# Patient Record
Sex: Female | Born: 1955 | Race: White | Hispanic: Yes | Marital: Married | Smoking: Never smoker
Health system: Northeastern US, Community
[De-identification: ages and names within clinical notes are randomized; demographics above are authoritative.]

## PROBLEM LIST (undated history)

## (undated) VITALS — BP 135/75 | HR 76 | Temp 98.0°F | Resp 20 | Ht 60.0 in | Wt 169.0 lb

## (undated) DIAGNOSIS — Z789 Other specified health status: Secondary | ICD-10-CM

---
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
## 1998-11-17 ENCOUNTER — Ambulatory Visit (HOSPITAL_BASED_OUTPATIENT_CLINIC_OR_DEPARTMENT_OTHER): Payer: Self-pay | Admitting: Internal Medicine

## 1998-11-22 ENCOUNTER — Ambulatory Visit (HOSPITAL_BASED_OUTPATIENT_CLINIC_OR_DEPARTMENT_OTHER): Payer: Self-pay | Admitting: Internal Medicine

## 2001-04-25 ENCOUNTER — Emergency Department (HOSPITAL_BASED_OUTPATIENT_CLINIC_OR_DEPARTMENT_OTHER): Payer: Self-pay | Admitting: Emergency Medicine

## 2002-11-05 ENCOUNTER — Ambulatory Visit (HOSPITAL_BASED_OUTPATIENT_CLINIC_OR_DEPARTMENT_OTHER): Payer: Self-pay | Admitting: Internal Medicine

## 2003-06-11 ENCOUNTER — Ambulatory Visit (HOSPITAL_BASED_OUTPATIENT_CLINIC_OR_DEPARTMENT_OTHER): Payer: Self-pay

## 2003-07-12 ENCOUNTER — Ambulatory Visit (HOSPITAL_BASED_OUTPATIENT_CLINIC_OR_DEPARTMENT_OTHER): Payer: Self-pay

## 2003-07-12 LAB — CHG LIPOPROTEIN DIR MEAS HIGH DENSITY CHOLESTEROL: HIGH DENSITY LIPOPROTEIN: 44 mg/dl (ref 35–95)

## 2003-07-12 LAB — BLOOD COUNT COMPLETE AUTOMATED
HEMATOCRIT: 38.4 % (ref 37.0–47.0)
HEMOGLOBIN: 13.3 g/dL (ref 12.0–16.0)
MEAN CORP HGB CONC: 34.6 g/dL (ref 32.0–36.0)
MEAN CORPUSCULAR HGB: 28.4 pg (ref 27.0–31.0)
MEAN CORPUSCULAR VOL: 82.3 fL (ref 81.0–99.0)
MEAN PLATELET VOLUME: 11.5 fL — ABNORMAL HIGH (ref 6.4–10.8)
PLATELET COUNT: 178 10*3/uL (ref 150–400)
RBC DISTRIBUTION WIDTH: 13.8 % (ref 11.5–14.3)
RED BLOOD CELL COUNT: 4.67 MIL/uL (ref 4.20–5.40)
WHITE BLOOD CELL COUNT: 5.5 10*3/uL (ref 4.8–10.8)

## 2003-07-12 LAB — CHOLESTEROL SERUM/WHOLE BLOOD TOTAL: Cholesterol: 227 mg/dl — ABNORMAL HIGH (ref 0–200)

## 2003-07-12 LAB — CYTOPATH, C/V, THIN LAYER

## 2003-07-13 LAB — IADNA CHLAMYDIA TRACHOMATIS DIRECT PROBE TQ: GENPROBE CHLAMYDIA: NEGATIVE

## 2003-07-13 LAB — IADNA NEISSERIA GONORRHOEAE DIRECT PROBE TQ: GENPROBE GC: NEGATIVE

## 2003-09-27 ENCOUNTER — Other Ambulatory Visit (HOSPITAL_BASED_OUTPATIENT_CLINIC_OR_DEPARTMENT_OTHER): Payer: Self-pay

## 2003-09-27 DIAGNOSIS — Z1231 Encounter for screening mammogram for malignant neoplasm of breast: Secondary | ICD-10-CM

## 2003-11-14 LAB — MA SCREENING MAMMO BILATERAL WITH CAD

## 2005-02-12 ENCOUNTER — Encounter (HOSPITAL_BASED_OUTPATIENT_CLINIC_OR_DEPARTMENT_OTHER): Payer: Self-pay

## 2005-02-12 ENCOUNTER — Ambulatory Visit (HOSPITAL_BASED_OUTPATIENT_CLINIC_OR_DEPARTMENT_OTHER): Payer: HMO

## 2005-02-12 VITALS — BP 136/86 | HR 84 | Temp 98.0°F | Ht 59.0 in | Wt 158.0 lb

## 2005-02-12 DIAGNOSIS — G5600 Carpal tunnel syndrome, unspecified upper limb: Secondary | ICD-10-CM | POA: Insufficient documentation

## 2005-02-12 DIAGNOSIS — B079 Viral wart, unspecified: Secondary | ICD-10-CM

## 2005-02-12 MED ORDER — WRIST SPLINT MISC
Status: DC
Start: 2005-02-12 — End: 2010-02-28

## 2005-02-12 NOTE — Progress Notes (Signed)
S: Deborah Mathis is a 49 year old female who complains of numbness of lateral aspect of bilateral hand, especially with use of the hand and at night. R handed, no new taks, activities, no trauma. R>L, but mostly feel about the same. Not every night, better when she can shake it off. Not really pain, just the numbness.     Wart: L hand, middle finger. Large, bothersome, for a few weeks. No pain, itching. Would like treatment.     Hands sweat a lot, day and night, for some time .,not elsewhere. Has tried a poder over the counter, always been this way.     Questions re her husband's prostate bx, upcoming, unhappy with anesth available .advised discuss with PCP, and/or specialist, am unqualified to decide type of anesthesia provided.     Of note, pt arrived for appt 20 minutes late.     O:  BP 136/86   Pulse 84   Temp (Src) 98 (Oral)   Ht 4\' 11"  (1.64m)   Wt 158 lbs (71.7kg)   SaO2 100%    She appears well, NAD. Hand exam revealed bilaterally normal motor power and no atrophy. No nodules are noted.   RIGHT hand: Phalen's sign negative - Tinel's sign is negative.  LEFT hand: Phalen is negative - Tinel is negative. No hypothenar wasting. Grip strength, pulses and ROM hand, wrist nl, ROM. Neck: supple, no LAD. Nl finger to thumb strength.    Large wart noted L middle finger, thumb side at DIP. Reviewed r/b treatment. Cleansed, debrided slightly. Treated with 50 secinds histofreeze until whitening, pt tolerated well. Covered.     Hands bil are slightly sweaty to the touch. Nl skin tone/texture elsewhere.     A: Carpal tunnel syndrome.   wart    P: Explanation of median nerve entrapment is given. A wrist splint (for bil) is provided for prn use, especially at night. The treatment spectrum is discussed, including possible surgical release if the symptoms are persistent and severe. Return as needed for this problem.    Retreat wart in 2 weeks. Can try OTC antiperspirants for her pams, readdress when mmore time permits, or f/up  with her PCP for a PE.

## 2005-02-25 ENCOUNTER — Ambulatory Visit (HOSPITAL_BASED_OUTPATIENT_CLINIC_OR_DEPARTMENT_OTHER): Payer: HMO

## 2005-03-04 ENCOUNTER — Ambulatory Visit (HOSPITAL_BASED_OUTPATIENT_CLINIC_OR_DEPARTMENT_OTHER): Payer: HMO

## 2005-03-04 VITALS — BP 120/88 | HR 78 | Temp 98.4°F

## 2005-03-04 DIAGNOSIS — B079 Viral wart, unspecified: Secondary | ICD-10-CM

## 2005-03-04 DIAGNOSIS — Q828 Other specified congenital malformations of skin: Secondary | ICD-10-CM

## 2005-03-04 NOTE — Progress Notes (Signed)
Deborah Mathis is a 50 year old female  Here for repeat wart treatment. Seen 4/11. Better with the treatment, almost gone.   Would like referral to derm for skin lesion above R eye, there for yrs, another bewteen eyebrows R side. One eyelid seems to be grwoing a bit. No pain, bleeding. Otherwise well. Advised full PE with PPC, will bok for later in the summer.     OBJECTIVE:  BP 120/88   Pulse 78   Temp (Src) 98.4 (Oral)   SaO2 99%  Wart L hand, middle finger.   Area cleansed, dry, top section debrided. Shitorfreeze applied for 50 seconds. Pt tolerated well. Covered.   Warty growth R eyelid, about 2x3 mm. Medial side face between eyebrows, smooth, about 5x5 mm at base.   ASSESSMENT:  Wart  PLAN:  Repeat in about 1-2 weks if no resolution. Ref to plastics for removal. Reassured not dangerous. Will call pr.

## 2005-03-25 ENCOUNTER — Other Ambulatory Visit (HOSPITAL_BASED_OUTPATIENT_CLINIC_OR_DEPARTMENT_OTHER): Payer: HMO | Admitting: Plastic Surgery

## 2005-03-25 DIAGNOSIS — D3190 Benign neoplasm of unspecified part of unspecified eye: Secondary | ICD-10-CM

## 2005-03-25 DIAGNOSIS — D367 Benign neoplasm of other specified sites: Secondary | ICD-10-CM

## 2005-04-22 ENCOUNTER — Encounter (HOSPITAL_BASED_OUTPATIENT_CLINIC_OR_DEPARTMENT_OTHER): Payer: Self-pay

## 2005-04-22 ENCOUNTER — Ambulatory Visit (HOSPITAL_BASED_OUTPATIENT_CLINIC_OR_DEPARTMENT_OTHER): Payer: HMO

## 2005-04-22 VITALS — BP 120/80 | HR 82 | Temp 98.0°F | Ht 59.5 in | Wt 161.0 lb

## 2005-04-22 DIAGNOSIS — J209 Acute bronchitis, unspecified: Secondary | ICD-10-CM

## 2005-04-22 DIAGNOSIS — R7611 Nonspecific reaction to tuberculin skin test without active tuberculosis: Secondary | ICD-10-CM | POA: Insufficient documentation

## 2005-04-22 MED ORDER — DOXYCYCLINE HYCLATE 100 MG PO TABS
ORAL_TABLET | ORAL | Status: DC
Start: 2005-04-22 — End: 2010-02-28

## 2005-04-22 NOTE — Progress Notes (Signed)
SUBJECTIVE:   Deborah Mathis is a 49 year old female who complains of sore throat, chest congestion, productive cough, chest pain during cough and productive cough for 10 days. She denies a history of fevers and denies a history of asthma. Patient does not smoke cigarettes.   taking otc cold meds  Pmhx: positive PPD  OBJECTIVE:  BP 120/80   Pulse 82   Temp (Src) 98 (Oral)   Ht 4' 11.5" (1.42m)   Wt 161 lbs (73.0kg)   SaO2 97%   LMP 04/05/2005    She appears well, vital signs are as noted by the nurse. Ears normal. Throat and pharynx normal. Neck supple. No adenopathy in the neck. Nose is congested. Sinuses non tender. The chest is clear, without wheezes or rales.    ASSESSMENT:   bronchitis    PLAN:  Symptomatic therapy suggested: push fluids, rest and ROV prn if symptoms persist or worsen. Call or return to clinic prn if these symptoms worsen or fail to improve as anticipated.

## 2005-04-27 ENCOUNTER — Ambulatory Visit (HOSPITAL_BASED_OUTPATIENT_CLINIC_OR_DEPARTMENT_OTHER): Payer: HMO | Admitting: Internal Medicine

## 2005-04-27 VITALS — BP 110/70 | HR 78 | Temp 98.0°F | Wt 164.0 lb

## 2005-04-27 DIAGNOSIS — J209 Acute bronchitis, unspecified: Secondary | ICD-10-CM

## 2005-04-27 DIAGNOSIS — K219 Gastro-esophageal reflux disease without esophagitis: Secondary | ICD-10-CM

## 2005-04-27 LAB — XR CHEST 2 VIEWS

## 2005-04-27 MED ORDER — ONE-A-DAY WOMENS FORMULA PO TABS
ORAL_TABLET | ORAL | Status: DC
Start: 2005-04-27 — End: 2010-02-28

## 2005-04-27 MED ORDER — ACETAMINOPHEN-CODEINE #3 300-30 MG PO TABS
ORAL_TABLET | ORAL | Status: DC
Start: 2005-04-27 — End: 2010-02-28

## 2005-04-27 MED ORDER — PEPCID AC 10 MG PO TABS
ORAL_TABLET | ORAL | Status: DC
Start: 2005-04-27 — End: 2010-02-28

## 2005-04-27 NOTE — Progress Notes (Signed)
Deborah Mathis is a 49 year old female who was recently seen by Dr. Samuel Bouche for a cough of acute bronchitis. She was treated with doxycycline. The cough is slighly improved and she feels generally better systemically but the cough is still quite severe at night and it is keeping her awake. No fevers or SOB.    She also has GERD which is worse with the cough. She uses OTC Pepcid AC prn.    Social History   Marital Status: Married    Social History Main Topics   Tobacco Use: Never    Alcohol Use: No       She wondered about vitamins and I suggested One-a-Day [employer].    BP 110/70   Pulse 78   Temp (Src) 98 (Oral)   Wt 164 lbs (74.4kg)   SaO2 100%   EENT: Coryza  Lungs: There are no wheezes. There are some coarse bronchial breath sounds in the right upper lobe, heard best anteriorly.    IMP:  Because of focal findings on chest exam, I woud like to see a chest X-ray. She will get that today. It is also reasonable to give her some T#3 as a cough suppressant and she can supplement that with Robitussin DM.    466.0 ACUTE BRONCHITIS (primary encounter diagnosis)  Note: as noted above.   Plan: ACETAMINOPHEN-CODEINE #3 300-30 MG OR TABS       530.81 ESOPHAGEAL REFLUX  Note: As noted.  Plan: PEPCID AC 10 MG OR TABS

## 2005-05-19 ENCOUNTER — Encounter (HOSPITAL_BASED_OUTPATIENT_CLINIC_OR_DEPARTMENT_OTHER): Payer: Self-pay | Admitting: Internal Medicine

## 2005-09-30 ENCOUNTER — Encounter (HOSPITAL_BASED_OUTPATIENT_CLINIC_OR_DEPARTMENT_OTHER): Payer: HMO | Admitting: Plastic Surgery

## 2005-09-30 DIAGNOSIS — D2310 Other benign neoplasm of skin of unspecified eyelid, including canthus: Secondary | ICD-10-CM

## 2005-10-02 LAB — SURGICAL PATH SPECIMEN

## 2005-10-03 LAB — SURG SPEC CLINIC NOTE

## 2005-10-07 ENCOUNTER — Ambulatory Visit (HOSPITAL_BASED_OUTPATIENT_CLINIC_OR_DEPARTMENT_OTHER): Payer: HMO | Admitting: Plastic Surgery

## 2005-10-07 DIAGNOSIS — D2330 Other benign neoplasm of skin of unspecified part of face: Secondary | ICD-10-CM

## 2005-11-05 LAB — SURG SPEC CLINIC NOTE

## 2010-01-11 ENCOUNTER — Encounter (HOSPITAL_BASED_OUTPATIENT_CLINIC_OR_DEPARTMENT_OTHER): Payer: Self-pay

## 2010-01-12 ENCOUNTER — Ambulatory Visit (HOSPITAL_BASED_OUTPATIENT_CLINIC_OR_DEPARTMENT_OTHER): Payer: BLUE CROSS/BLUE SHIELD

## 2010-01-12 ENCOUNTER — Encounter (HOSPITAL_BASED_OUTPATIENT_CLINIC_OR_DEPARTMENT_OTHER): Payer: Self-pay

## 2010-01-12 VITALS — BP 120/78 | HR 80 | Temp 98.1°F | Ht 60.08 in | Wt 169.0 lb

## 2010-01-12 DIAGNOSIS — Z23 Encounter for immunization: Secondary | ICD-10-CM

## 2010-01-12 DIAGNOSIS — Z0000 Encounter for general adult medical examination without abnormal findings: Secondary | ICD-10-CM

## 2010-01-12 DIAGNOSIS — M19049 Primary osteoarthritis, unspecified hand: Secondary | ICD-10-CM

## 2010-01-12 DIAGNOSIS — K219 Gastro-esophageal reflux disease without esophagitis: Secondary | ICD-10-CM

## 2010-01-12 DIAGNOSIS — Z1159 Encounter for screening for other viral diseases: Secondary | ICD-10-CM

## 2010-01-12 DIAGNOSIS — Z124 Encounter for screening for malignant neoplasm of cervix: Secondary | ICD-10-CM

## 2010-01-12 DIAGNOSIS — N852 Hypertrophy of uterus: Secondary | ICD-10-CM

## 2010-01-12 MED ORDER — IBUPROFEN 600 MG PO TABS
600.0000 mg | ORAL_TABLET | Freq: Three times a day (TID) | ORAL | Status: DC | PRN
Start: 2010-01-12 — End: 2011-05-03

## 2010-01-12 MED ORDER — LANSOPRAZOLE 15 MG PO CPDR
15.00 mg | DELAYED_RELEASE_CAPSULE | Freq: Every day | ORAL | Status: AC
Start: 2010-01-12 — End: 2011-01-13

## 2010-01-12 NOTE — Patient Instructions (Signed)
[facility]  [facility]  [address]  [place] [address]  Clinical Nutrition Services    Calcium and Your Health    Calcium is a mineral that helps build strong bones and teeth, helps prevent brittle bones (osteoporosis) and hip fractures, and helps maintain a normal blood pressure. It is very important to get enough calcium each day.    * CALCIUM NEEDS AT ALL AGES: (RDI 2000)  Infants birth to 12 months  is 210 -270 mg/day          Child 1-8 years  is 500-.800 mg/day                     Adolescent 9-18years is 1300 mg/day                   Adults 19-30 years is 1000 mg/day  Adults 31-50 years is 1000 mg/day  Adults 51 and older is 1200 mg/day    * TO MAINTAIN GOOD BONE HEALTH, EAT A CALCIUM RICH DIET:    FOODS                                      Yogurt, plain, nonfat: 1 cup serving is 450 mgs of calcium    Ricotta cheese, part skim: 1/2 cup serving is 340 mgs of calcium    Milk, skim: 1 cup serving is 300 mgs of calcium    Milk, whole: 1 cup serving is 290 mgs of calcium    Sardines, canned, with bones: 3 oz serving is 280 mgs of calcium    Yogurt, plain, whole milk:1 cup serving is 275 mgs of calcium    Orange juice, calcium fortified:1 cup serving is 270 mgs of calcium    Swiss cheese: 1 oz serving is 270 mgs of calcium    Spinach, cooked: 1 cup serving is 240 mgs of calcium   Turnip greens, rhubarb, cooked:1 cup serving is 200 mgs of calcium   Salmon, canned, with bones: 3 oz serving is 180 mgs of calcium   Ice Cream: 1 cup serving is 175 mgs of calcium   Pudding, instant mix: 1/2 cup serving is 150 mgs of calcium   Almonds:  /2 cup serving is 150 mgs of calcium   Kale, cooked: 1 cup serving is 100 mgs of calcium   Lobster, cooked: 6 oz serving is 100 mgs of calcium   Tofu, with calcium sulfate:  3oz serving is 100 mgs of calcium     EXERCISE: Weight bearing exercises, such as walking, jogging, racquet sports, aerobics, and weight training  help make bones stronger.     VITAMIN  D: Vitamin D is essential to help your body absorb calcium. A healthy body can make its own vitamin D with the help of sunlight. But in the winter months, you may not get enough sun, and should make   sure you get it from another source, like milk or a multivitamin.     HORMONES: The hormone estrogen helps the female body and bones to use calcium. After menopause women need to discuss hormone treatment with their doctor.    * This is just a first step in helping improve your health.  * For help with a meal plan for you, make an appointment with the Nutritionist.  * For more information, you can also contact the National Nutrition Hotline 1-800-366-1655                                                                9/95 MAS 1/96, 12/97eqj, 01/02, 4/02

## 2010-01-12 NOTE — Progress Notes (Signed)
FEMALE PHYSICAL      SUBJECTIVE:    Deborah Mathis is a 54 year old female who presents for a physical exam. She was last seen here 5 years ago, and in interim has had no regular health care.      The patient's current concerns are arthritis of hands, intermittent; does assembly work, many fine motor movements. Worse in damp weather. Pain releived with ibuprofen 400 mg. No other joints involved.    Also reports reflux symptoms occasionally , exacerbated with fried food, caffeine, alcohol.. Prevacid OTC relieves completely; main symptoms is heartburn    Patient Active Problem List:     CARPAL TUNNEL SYNDROME [354.0]     TUBERCULIN TEST REACTION NO TBC [795.5]      Medication reconciliation performed today.    Social History    Marital Status: Married             Spouse Name:                       Years of Education:                 Number of children:               Social History Main Topics    Tobacco Use: Never           Alcohol Use: No                No family history on file.    I have reviewed the past medical, surgical, social and family history and updated these sections of EpicCare as relevant. All interim labs, test results, and consult notes were reviewed and discussed with [name]. Medications were reconciled during this visit and a current medication list was given to the patient at the end of the visit.    REVIEW OF SYSTEMS:  CONSTITUTIONAL:  Denies fatigue,  EYES:  Denies visual changes or pain  ENT:  Denies sore throat, nasal discharge, sinus pain, difficulty hearing.  CARDIOVASCULAR:  Denies chest pain, palpitations or swelling  RESPIRATORY:  Denies cough or shortness of breath  GI:  Denies abdominal pain, nausea, vomiting; no change in bowel habits  MUSCULOSKELETAL:  Denies back pain  SKIN:  No rash or worrisome lesions   NEUROLOGIC:  Denies headache, focal weakness or sensory changes. Has noted dome numbness and tingling  In left hand when she wakes up in AM; usually relieved by  shaking oout her hand. Does have hx of carpal tunnel syndrome   ENDOCRINE:   Weight gain of 10 lbs in last 5 years  GYN: still having regular menses, heavy, dysmenorrhea on first day.  Has not used contraception in 20 years, without conception  LYMPHATIC:  Denies swollen glands  PSYCHIATRIC:  Denies depression, anxiety  All systems negative except as marked.    OBJECTIVE:  Vitals:BP 120/78   Pulse 80   Temp(Src) 98.1 F (36.7 C) (Oral)   Ht 5' 0.08" (1.526 m)   Wt 169 lb (76.658 kg)   SpO2 98%  Constitutional:   WDWN obese woman, alert, cooperative and in no apparent distress   HENT: Normocephalic, Atraumatic, Bilateral external ears normal, Oropharynx moist, No oral exudates, Nose normal.  Eyes:  PERRL, EOMI, Conjunctiva normal, No discharge.  Neck: Normal range of motion, No tenderness, Supple, No lymphadenopathy,   Thyroid: No thyromegaly, non-tender, no nodules.  Cardiovascular:  Regular rate and rhythm.  No murmurs, clicks, rubs.  No  S3, S4.  Pulmonary/Chest:  Normal breath sounds, No respiratory distress, No wheezing, rales,or rhonchi. No chest tenderness  Breasts: Symmetric, No masses or tenderness, No nipple discharge.   Abdomen:  Bowel sounds normal, soft.  No tenderness, no masses, no hepatosplenomegaly  Back:  No musculoskeletal tenderness, No CVA tenderness  Extremities:  Normal range of motion, Intact distal pulses, No edema, No tenderness. Negative tinel's nad PHelan's signs  Pelvic:  Normal external genitalia.  Normal vagina with no polyps or lesions and with some dark blood (end of menses).  Normal cervix with normal mucosa and without CMT.  Uterus: Nontender, but  Enlarged to 12-16 week size. no nodules. Adnexae negative.  Lymphatic:  No lymphadenopathy noted  Neurologic:  Alert & oriented , Normal motor function, Normal sensory function, No focal defecits, normal gait  Skin:  Warm, Dry, No erythema, No rash  Psychiatric:  Affect normal, Judgement normal, Mood normal    ASSESSMENT/PLAN:    V70.0  Routine general medical examination at a health care facility  (primary encounter diagnosis)  Comment: generally in god health although obese. No regular exercise. 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  Comment: I doubt systemic arthritis, but she does seem yong to be starting Heberdens' nodes and deviation of DIP joints  Plan: ibuprofen (ADVIL,MOTRIN) 600 MG tablet, URIC         ACID BLD, ANTINUCLEAR ANTIBODY, RHEUMATOID         FACTOR QUAL          621.2A Enlarged uterus  Comment: most likely fibroid  Plan: ORDER FOR ULTRASOUND        pelvic    530.81K GERD (gastroesophageal reflux disease)  Comment: more problematic when needing to take ibuprofen   Plan: lansoprazole (PREVACID) 15 MG capsule        Daily as needed    V73.89 Special screening examination for other specified viral diseases  Comment:   Plan: HUMAN PAPILLOMAVIRUS            V76.2 Screening for malignant neoplasm of the cervix  Comment:   Plan: CYTOPATH, C/V, THIN LAYER            V06.1P Need for Tdap vaccination  Comment: overdue  Plan: TDAP VACCINE 7 YR + IM, TDAP VACCINE 7 YR + IM        Future ordered

## 2010-01-17 ENCOUNTER — Ambulatory Visit (HOSPITAL_BASED_OUTPATIENT_CLINIC_OR_DEPARTMENT_OTHER): Payer: Self-pay

## 2010-01-18 ENCOUNTER — Telehealth (HOSPITAL_BASED_OUTPATIENT_CLINIC_OR_DEPARTMENT_OTHER): Payer: Self-pay

## 2010-01-18 LAB — US PELVIC NON-PREGNANT

## 2010-01-18 LAB — US TRANSVAGINAL NON-OB

## 2010-01-18 LAB — HUMAN PAPILLOMAVIRUS (HPV): HUMAN PAPILLOMAVIRUS: NEGATIVE

## 2010-01-18 LAB — US 3D RENDERING WO DEDICATED WORKSTATION

## 2010-01-18 NOTE — Telephone Encounter (Signed)
I left a message on the patient's voice mail. with resslts of normal pelvic ultrasound plus 2 small fibroids. Asked for her to call back if quesxtions.

## 2010-01-22 ENCOUNTER — Ambulatory Visit (HOSPITAL_BASED_OUTPATIENT_CLINIC_OR_DEPARTMENT_OTHER): Payer: BLUE CROSS/BLUE SHIELD

## 2010-01-22 DIAGNOSIS — M19049 Primary osteoarthritis, unspecified hand: Secondary | ICD-10-CM

## 2010-01-22 DIAGNOSIS — Z0000 Encounter for general adult medical examination without abnormal findings: Secondary | ICD-10-CM

## 2010-01-22 LAB — CBC WITH PLATELET
HEMATOCRIT: 32.7 % — ABNORMAL LOW (ref 36.0–48.0)
HEMOGLOBIN: 10.9 g/dl — ABNORMAL LOW (ref 12.0–16.0)
MEAN CORP HGB CONC: 33.2 g/dl (ref 32.0–36.0)
MEAN CORPUSCULAR HGB: 22.7 pg — ABNORMAL LOW (ref 27.0–33.0)
MEAN CORPUSCULAR VOL: 68.5 fl — CL (ref 80.0–100.0)
MEAN PLATELET VOLUME: 10.2 fl (ref 6.4–10.8)
PLATELET COUNT: 143 10*3/uL — ABNORMAL LOW (ref 150–400)
RBC DISTRIBUTION WIDTH: 18.3 % — ABNORMAL HIGH (ref 11.5–14.3)
RED BLOOD CELL COUNT: 4.77 M/uL (ref 4.50–5.10)
WHITE BLOOD CELL COUNT: 5.2 10*3/uL (ref 4.0–10.8)

## 2010-01-22 NOTE — Progress Notes (Signed)
Labs drawn

## 2010-01-24 LAB — CYTOPATH, C/V, THIN LAYER

## 2010-01-29 ENCOUNTER — Ambulatory Visit (HOSPITAL_BASED_OUTPATIENT_CLINIC_OR_DEPARTMENT_OTHER): Payer: BLUE CROSS/BLUE SHIELD

## 2010-01-29 VITALS — BP 124/80 | HR 79 | Temp 98.6°F | Wt 169.0 lb

## 2010-01-29 DIAGNOSIS — D649 Anemia, unspecified: Secondary | ICD-10-CM

## 2010-01-29 DIAGNOSIS — Z23 Encounter for immunization: Secondary | ICD-10-CM

## 2010-01-29 DIAGNOSIS — D259 Leiomyoma of uterus, unspecified: Secondary | ICD-10-CM

## 2010-01-29 DIAGNOSIS — D509 Iron deficiency anemia, unspecified: Secondary | ICD-10-CM

## 2010-01-29 LAB — CBC, PLATELET & DIFFERENTIAL
BASOPHIL %: 0.6 % (ref 0.0–2.0)
EOSINOPHIL %: 2.3 % (ref 0.0–7.0)
HEMATOCRIT: 33.6 % — ABNORMAL LOW (ref 36.0–48.0)
HEMOGLOBIN: 11.1 g/dl — ABNORMAL LOW (ref 12.0–16.0)
LYMPHOCYTE %: 21.9 % (ref 13.0–39.0)
MEAN CORP HGB CONC: 32.8 g/dl (ref 32.0–36.0)
MEAN CORPUSCULAR HGB: 22.3 pg — ABNORMAL LOW (ref 27.0–33.0)
MEAN CORPUSCULAR VOL: 68 fl — CL (ref 80.0–100.0)
MEAN PLATELET VOLUME: 10 fl (ref 6.4–10.8)
MONOCYTE %: 6.6 % (ref 1.0–12.0)
NEUTROPHIL %: 68.6 % (ref 46.0–79.0)
PLATELET COUNT: 165 10*3/uL (ref 150–400)
RBC DISTRIBUTION WIDTH: 18 % — ABNORMAL HIGH (ref 11.5–14.3)
RED BLOOD CELL COUNT: 4.95 M/uL (ref 4.50–5.10)
WHITE BLOOD CELL COUNT: 7.6 10*3/uL (ref 4.0–10.8)

## 2010-01-29 LAB — LACTATE DEHYDROGENASE: LACTATE DEHYDROGENASE: 135 IU/L (ref 100–242)

## 2010-01-29 LAB — CHG IRON BINDING CAPACITY: TOTAL IRON BIND CAPACITY CALC: 540 ug/dL (ref 282–427)

## 2010-01-29 LAB — IRON: IRON: 20 ug/dl — ABNORMAL LOW (ref 28–170)

## 2010-01-29 LAB — BASIC METABOLIC PANEL
ANION GAP: 5 mmol/L (ref 2–25)
BUN (UREA NITROGEN): 11 mg/dl (ref 6–20)
CALCIUM: 8.7 mg/dl (ref 8.6–10.3)
CARBON DIOXIDE: 24 mmol/L (ref 22–32)
CHLORIDE: 110 mmol/L (ref 101–111)
CREATININE: 0.5 mg/dl (ref 0.4–1.2)
ESTIMATED GLOMERULAR FILT RATE: >60 mL/min (ref >60)
Glucose Random: 106 mg/dl (ref 74–160)
POTASSIUM: 3.9 mmol/L (ref 3.5–5.1)
SODIUM: 139 mmol/L (ref 135–144)

## 2010-01-29 LAB — RBCMORPH

## 2010-01-29 LAB — CHG ASSAY OF FOLIC ACID SERUM: FOLATE: 14.6 ng/mL (ref 3.0–?)

## 2010-01-29 LAB — VITAMIN B12: VITAMIN B12: 418 pg/mL (ref 180–914)

## 2010-01-29 LAB — CHG BLOOD COUNT RETICULOCYTE AUTOMATED: RETICULOCYTE COUNT: 2 % (ref 0.5–2.0)

## 2010-01-29 NOTE — Progress Notes (Signed)
SUBJECTIVE:  Deborah Mathis is a 54 year old female presents today for follow up of anemia, fibroid uterus.    Results: pelvic ultrasound showed 2 smll fibroids, one submucosal    Patient Active Problem List:     CARPAL TUNNEL SYNDROME [354.0]     TUBERCULIN TEST REACTION NO TBC [795.5]     Anemia [285.9Y]      Medication reconciliation performed today.     Social History Narrative    Born: Born [place], came to [place] age 20    Family: lives with husband and son (b. 17)    Education: has GED    Social: 3 bros and 1 sister living in [place] (one brother dec.d/t CAD)    Occupation:assembly line work    Leisure:              Review of Systems  none  OBJECTIVE:  BP 124/80   Pulse 79   Temp(Src) 98.6 F (37 C) (Oral)   Wt 169 lb (76.658 kg)   SpO2 99%   General:  WDWN overweight woman, alert, cooperative and in no apparent distress   Not otherwise examined today.     ASSESSMENT/PLAN:    218.9K Uterine fibroid  Comment: 2 smal fibroids, one submucosal may be source of heavy menses  Plan: repeat ultrasound in 6-12 months    285.9Y Anemia  Comment: 3/30 - results of lab tests sows Fe deficiency anemia.  Plan: COMPLETE CBC, AUTOMATED, IRON, IRON BINDING         TEST, ROUTINE VENIPUNCTURE, VITAMIN B-12, BLOOD        FOLIC ACID SERUM, RETICULOCYTE COUNT        3/20 sent letter to start iron supplements (FeSO4 325 daily to twice dailyf ro 6 months.       Need for Tdap - deferred    follow up 3 months for Hct check

## 2010-01-29 NOTE — Progress Notes (Addendum)
Quick Note:    Discussed with the patient at offic e visit  ______

## 2010-01-31 DIAGNOSIS — D259 Leiomyoma of uterus, unspecified: Secondary | ICD-10-CM | POA: Insufficient documentation

## 2010-01-31 DIAGNOSIS — D509 Iron deficiency anemia, unspecified: Secondary | ICD-10-CM | POA: Insufficient documentation

## 2010-01-31 MED ORDER — FERROUS SULFATE 325 (65 FE) MG PO TABS
325.0000 mg | ORAL_TABLET | Freq: Every day | ORAL | Status: DC
Start: 2010-01-31 — End: 2010-02-28

## 2010-02-02 LAB — CHG LIPID PANEL
Cholesterol: 206 mg/dl — ABNORMAL HIGH (ref 0–200)
HIGH DENSITY LIPOPROTEIN: 38 mg/dl (ref 35–85)
LOW DENSITY LIPOPROTEIN DIRECT: 140 mg/dl — ABNORMAL HIGH (ref 0–100)
RISK FACTOR: 5.4 — ABNORMAL HIGH (ref ?–4.4)
TRIGLYCERIDES: 170 mg/dl — ABNORMAL HIGH (ref 0–150)

## 2010-02-02 LAB — CHG RHEUMATOID FACTOR QUALITATIVE: RHEUMATOID FACTOR: NEGATIVE

## 2010-02-02 LAB — CHG ASSAY OF BLOOD/URIC ACID: URIC ACID: 4.7 mg/dl (ref 2.6–8.0)

## 2010-02-02 LAB — HIV 1 AND 2 PLUS O ANTIBODY: HIV 1 AND 2 PLUS O SCREEN: NONREACTIVE

## 2010-02-02 LAB — ANTINUCLEAR ANTIBODY: ANTINUCLEAR ANTIBODY: NEGATIVE

## 2010-02-20 ENCOUNTER — Ambulatory Visit (HOSPITAL_BASED_OUTPATIENT_CLINIC_OR_DEPARTMENT_OTHER): Payer: BLUE CROSS/BLUE SHIELD | Admitting: Registered Nurse

## 2010-02-20 ENCOUNTER — Ambulatory Visit (HOSPITAL_BASED_OUTPATIENT_CLINIC_OR_DEPARTMENT_OTHER): Payer: Self-pay

## 2010-02-20 VITALS — BP 132/82 | HR 72 | Resp 20 | Wt 169.0 lb

## 2010-02-20 DIAGNOSIS — Z0130 Encounter for examination of blood pressure without abnormal findings: Secondary | ICD-10-CM

## 2010-02-20 NOTE — Progress Notes (Signed)
Pt here for BP check, BP 132/82    Pt states, " eating better and walking more"    Report to Dr, Zachery Dauer

## 2010-02-28 ENCOUNTER — Other Ambulatory Visit (HOSPITAL_BASED_OUTPATIENT_CLINIC_OR_DEPARTMENT_OTHER): Payer: Self-pay

## 2010-02-28 ENCOUNTER — Ambulatory Visit (HOSPITAL_BASED_OUTPATIENT_CLINIC_OR_DEPARTMENT_OTHER): Payer: BLUE CROSS/BLUE SHIELD | Admitting: Internal Medicine

## 2010-02-28 VITALS — BP 130/80 | HR 99 | Temp 97.6°F | Wt 170.5 lb

## 2010-02-28 DIAGNOSIS — L732 Hidradenitis suppurativa: Secondary | ICD-10-CM

## 2010-02-28 MED ORDER — DOXYCYCLINE HYCLATE 100 MG PO TBEC
100.00 mg | DELAYED_RELEASE_TABLET | Freq: Two times a day (BID) | ORAL | Status: AC
Start: 2010-02-28 — End: 2010-03-10

## 2010-02-28 NOTE — Progress Notes (Signed)
Hx: Deborah Mathis 54 year old female complains of Left armpit pain for 3 days, getting progressively worse. Noticed discharge coming out this morning.  No fever, no chills      VS: BP 130/80   Pulse 99   Temp(Src) 97.6 F (36.4 C) (Temporal)   Wt 170 lb 8 oz (77.338 kg)   SpO2 100%  PE:  Appearance: NAD  Skin: Left axillary area positive for  erythema, swelling, tenderness and hotter to touch, positive for oozing purulent discharge out off one spot, 2 other abscess coming up  Chest: good air entry b/l, no wheezing/rhonchi/crackles, no retractions/flaring  Heart: RRR, no M/R/G      Impression: Hydroadenitis    Plan: wound culture taken    Wet warm compress, antibiotic prescribed and discussed. Pt referred to general surgery, will be seen tomorrow.

## 2010-02-28 NOTE — Progress Notes (Addendum)
Addended by: Peterson Mathey on: 02/28/2010      Modules accepted: Orders

## 2010-03-01 ENCOUNTER — Encounter (HOSPITAL_BASED_OUTPATIENT_CLINIC_OR_DEPARTMENT_OTHER): Payer: Self-pay | Admitting: Surgery

## 2010-03-01 ENCOUNTER — Ambulatory Visit (HOSPITAL_BASED_OUTPATIENT_CLINIC_OR_DEPARTMENT_OTHER): Payer: BLUE CROSS/BLUE SHIELD | Admitting: Surgery

## 2010-03-01 ENCOUNTER — Other Ambulatory Visit (HOSPITAL_BASED_OUTPATIENT_CLINIC_OR_DEPARTMENT_OTHER): Payer: Self-pay

## 2010-03-01 VITALS — BP 147/92 | HR 74 | Temp 99.0°F

## 2010-03-01 DIAGNOSIS — L02412 Cutaneous abscess of left axilla: Secondary | ICD-10-CM

## 2010-03-01 NOTE — Progress Notes (Signed)
HPI Comments: Pt with new mass , likely abscess under arm, painful  7/10 pain  Drainage of pus  No fevers  Second episode previous one 6 months ago           Physical Exam   Pulmonary/Chest:             A/P:    L axillary abscess    The patient was taken to the APR and prepped and draped after informed consent obtained.  The area was anesthetized with local injection and an Incision and Drainage was perfomed using a scalpel.  The are was manually expressed to relieve the contents and a wick to promote drainage was left in place.  Wound care instructions and follow-up was discussed.

## 2010-03-01 NOTE — Telephone Encounter (Signed)
Message copied by Evangeline Gula on Thu Mar 01, 2010 12:01 PM  ------       Message from: Melida Quitter       Created: Wed Feb 28, 2010  5:13 PM       Regarding: rx ANTIBIOTIC       Contact: [phone]          FAMILY health              Person calling on behalf of patient: PT              May list multiple medications in this section       Medicine Name: ANTIBIOTICS ( PT DOES NOT KNOW THE NAME)              Documented patient preferred pharmacies:       [facility]              Phone: [phone] Fax: [phone]

## 2010-03-02 NOTE — Progress Notes (Addendum)
Pt referred to general surgery, prescribed Doxycycline.

## 2010-03-08 ENCOUNTER — Ambulatory Visit (HOSPITAL_BASED_OUTPATIENT_CLINIC_OR_DEPARTMENT_OTHER): Payer: BLUE CROSS/BLUE SHIELD | Admitting: Surgery

## 2010-03-08 ENCOUNTER — Encounter (HOSPITAL_BASED_OUTPATIENT_CLINIC_OR_DEPARTMENT_OTHER): Payer: Self-pay | Admitting: Surgery

## 2010-03-08 DIAGNOSIS — L02412 Cutaneous abscess of left axilla: Secondary | ICD-10-CM

## 2010-03-08 LAB — WOUND CULTURE AND GRAM STAIN

## 2010-03-08 LAB — WOUND CULTURE ANAEROBIC

## 2010-03-08 NOTE — Progress Notes (Signed)
Wick removed, resolved abscess    Likely a shaving related infection/process

## 2010-03-12 ENCOUNTER — Ambulatory Visit (HOSPITAL_BASED_OUTPATIENT_CLINIC_OR_DEPARTMENT_OTHER): Payer: BLUE CROSS/BLUE SHIELD

## 2010-03-12 VITALS — BP 144/80 | HR 77 | Temp 98.2°F | Wt 171.0 lb

## 2010-03-12 DIAGNOSIS — J302 Other seasonal allergic rhinitis: Secondary | ICD-10-CM

## 2010-03-12 DIAGNOSIS — D509 Iron deficiency anemia, unspecified: Secondary | ICD-10-CM

## 2010-03-12 DIAGNOSIS — H6090 Unspecified otitis externa, unspecified ear: Secondary | ICD-10-CM

## 2010-03-12 DIAGNOSIS — L732 Hidradenitis suppurativa: Secondary | ICD-10-CM

## 2010-03-12 DIAGNOSIS — R030 Elevated blood-pressure reading, without diagnosis of hypertension: Secondary | ICD-10-CM

## 2010-03-12 MED ORDER — HYDROCORTISONE-ACETIC ACID 1-2 % OT SOLN
3.00 [drp] | Freq: Three times a day (TID) | OTIC | Status: AC
Start: 2010-03-12 — End: 2010-04-11

## 2010-03-12 MED ORDER — LORATADINE 10 MG PO TABS
10.00 mg | ORAL_TABLET | Freq: Every day | ORAL | Status: AC
Start: 2010-03-12 — End: 2010-09-12

## 2010-03-12 MED ORDER — CROMOLYN SODIUM 4 % OP SOLN
1.00 [drp] | Freq: Four times a day (QID) | OPHTHALMIC | Status: AC
Start: 2010-03-12 — End: 2010-05-12

## 2010-03-12 MED ORDER — CALCIUM 600+D 600-400 MG-UNIT PO TABS
1.00 | ORAL_TABLET | Freq: Two times a day (BID) | ORAL | Status: AC
Start: 2010-03-12 — End: 2013-03-12

## 2010-03-12 MED ORDER — FERROUS GLUCONATE 325 MG PO TABS
325.00 mg | ORAL_TABLET | Freq: Every day | ORAL | Status: AC
Start: 2010-03-12 — End: 2010-09-12

## 2010-03-12 NOTE — Progress Notes (Signed)
SUBJECTIVE:  Deborah Mathis is a 54 year old female presents today for  follow up of hydradentits left axilla, HTN,  Fe deficiency anemia    SUBJECTIVE: NOtes that she saw Dr. Rosana Hoes, axillary abscess was I&Ded, wicked, took antibiotics , now resolved.    She reports significant seasonal allergies, especially  itchy eyes.  She takes OTC loratadine Zyrtec.    Patient states she did not receive my letter regarding her iron deficiency anemia, so has not started any iron supplementation.    Patient Active Problem List:     CARPAL TUNNEL SYNDROME [354.0]     TUBERCULIN TEST REACTION NO TBC [795.5]     Iron deficiency anemia [280.9AM]     Uterine fibroid [218.9K]      Medication reconciliation performed today.     Social History Narrative    Born: Born [place], came to [place] age 28    Family: lives with husband and son (b. 4)    Education: has GED    Social: 3 bros and 1 sister living in [place] (one brother dec.d/t CAD)    Occupation:assembly line work    Leisure:        Review of Systems  The patient denies cough fever chills or wheezing.    OBJECTIVE:  BP 144/80   Pulse 77   Temp(Src) 98.2 F (36.8 C) (Oral)   Wt 171 lb (77.565 kg)   SpO2 98% Repeat BP: 132/78  General: The patient is WPW and woman with obesity here in no acute distress.  HEENT:  no conjunctival or nasal congestion; pharynx: Slightly injected with postnasal drip; ears: Bilateral canals are red and somewhat macerated. TMs normal; nolymphadenopathy  Cardiovascular:  regular rate and rhythm, normal sounds and absence of murmurs, rubs or gallops  Lungs:  clear to auscultation without rales, ronchi, or wheezing  Abdomen:  soft, unremarkable and without evidence of organomegally, masses, or abdominal aortic enlargement, no guarding   Extremities:  Axillary exam revealed completely healed abscess with very little residual  Induration  Neuro:  nonfocal and normal sensation    ASSESSMENT/PLAN:    705.74F Hydradenitis  (primary encounter  diagnosis)  Comment: Problem resolved; I don ot feel any cystic cavity as possible nidus for another infection.  Plan: do not shave underarms    477.9U Seasonal allergies  Comment: generally itchy eyes most bothersome  Plan: loratadine (CLARITIN) 10 MG tablet, cromolyn         (OPTICROM) 4 % ophthalmic solution, Calcium         Carbonate-Vitamin D (CALCIUM 600+D) 600-400         MG-UNIT TABS          280.9AM Iron deficiency anemia  Comment: HCT in low 30's d/t increased  menstrual bleeding  From fibroids.  Plan: ferrous gluconate (FERGON) 325 MG tablet        Start with one daily, then increase to one bid. encuoraged iron containing foods.  Recheckl ferritin and Hct in 3 months    380.10X Otitis external  Comment: swimmers' ear  Plan: acetic acid-hydrocortisone (VOSOL-HC) otic         solution        Instructed to keep ears dry.    796.2 Elevated blood pressure reading without diagnosis of hypertension  Comment: Repeat BP within normal limits ; first indicates that she is deconditioned  Plan: incrase regular walking.    follow up 3 months

## 2010-03-19 ENCOUNTER — Encounter (HOSPITAL_BASED_OUTPATIENT_CLINIC_OR_DEPARTMENT_OTHER): Payer: Self-pay

## 2010-04-10 ENCOUNTER — Ambulatory Visit (HOSPITAL_BASED_OUTPATIENT_CLINIC_OR_DEPARTMENT_OTHER): Payer: Self-pay

## 2010-04-12 LAB — MA SCREENING MAMMO BILATERAL WITH CAD

## 2010-04-19 NOTE — Progress Notes (Addendum)
Quick Note:    Normal mammogram for tracking.  Thanks,  RB   ______

## 2010-07-27 ENCOUNTER — Ambulatory Visit (HOSPITAL_BASED_OUTPATIENT_CLINIC_OR_DEPARTMENT_OTHER): Payer: BLUE CROSS/BLUE SHIELD

## 2010-07-27 DIAGNOSIS — D509 Iron deficiency anemia, unspecified: Secondary | ICD-10-CM

## 2010-07-27 LAB — CBC WITH PLATELET
HEMATOCRIT: 39.3 % (ref 36.0–48.0)
HEMOGLOBIN: 13.6 g/dl (ref 12.0–16.0)
MEAN CORP HGB CONC: 34.6 g/dl (ref 32.0–36.0)
MEAN CORPUSCULAR HGB: 28.5 pg (ref 27.0–33.0)
MEAN CORPUSCULAR VOL: 82.5 fl (ref 80.0–100.0)
MEAN PLATELET VOLUME: 11.1 fl — ABNORMAL HIGH (ref 6.4–10.8)
PLATELET COUNT: 193 10*3/uL (ref 150–400)
RBC DISTRIBUTION WIDTH: 14.4 % — ABNORMAL HIGH (ref 11.5–14.3)
RED BLOOD CELL COUNT: 4.77 M/uL (ref 4.50–5.10)
WHITE BLOOD CELL COUNT: 5.4 10*3/uL (ref 4.0–10.8)

## 2010-07-27 LAB — CHG BLOOD COUNT RETICULOCYTE AUTOMATED: RETICULOCYTE COUNT: 1.7 % (ref 0.5–2.0)

## 2010-07-27 LAB — CHG IRON BINDING CAPACITY: TOTAL IRON BIND CAPACITY CALC: 469 ug/dL — ABNORMAL HIGH (ref 282–427)

## 2010-07-27 LAB — IRON: IRON: 198 ug/dl — ABNORMAL HIGH (ref 28–170)

## 2010-07-27 NOTE — Progress Notes (Signed)
Labs drawn

## 2010-08-06 ENCOUNTER — Ambulatory Visit (HOSPITAL_BASED_OUTPATIENT_CLINIC_OR_DEPARTMENT_OTHER): Payer: BLUE CROSS/BLUE SHIELD

## 2010-08-06 VITALS — BP 148/86 | HR 67 | Temp 97.8°F | Wt 170.0 lb

## 2010-08-06 DIAGNOSIS — D509 Iron deficiency anemia, unspecified: Secondary | ICD-10-CM

## 2010-08-06 DIAGNOSIS — Z1211 Encounter for screening for malignant neoplasm of colon: Secondary | ICD-10-CM

## 2010-08-06 NOTE — Progress Notes (Signed)
SUBJECTIVE:    Deborah Mathis is a 54 year old female presents today for follow up hydradenitis and anemia  Due for colonoscopy, Hep B screen, flu vax.    NOtes she feels stronger, less tired after iron supplementation. Still hving menses occasionally     Hydradenitis has resolved    Hepatitis B high risk screening:  History of blood transfusions: no  Multiple sexual partners:no  Frequent unprotected sex: no  Occupational exposures to blood and body fluids: no   HIstory of hepatitis: no    Type:     Discussed recommendations for colonoscopy; she is willing to have appointment scheduled.    Patient Active Problem List:     CARPAL TUNNEL SYNDROME [354.0]     TUBERCULIN TEST REACTION NO TBC [795.5]     Iron deficiency anemia [280.9AM]     Uterine fibroid [218.9K]     Hydradenitis [705.53F]     Seasonal allergies [477.9U]      Medication reconciliation performed today.     Social History Narrative    Born: Born [place], came to [place] age 19    Family: lives with husband and son (b. 3)    Education: has GED    Social: 3 bros and 1 sister living in [place] (one brother dec.d/t CAD)    Occupation:assembly line work    Leisure:            I have reviewed the past medical, surgical, social and family history and updated these sections of EpicCare as relevant. All interim labs, test results, and consult notes were reviewed and discussed with Deborah Mathis. Medications were reconciled during this visit and a current medication list was given to the patient at the end of the visit.   Review of Systems  No chest pain or shortness of breath feeling anxious.rbo not finding a parking space    OBJECTIVE:  BP 148/86   Pulse 67   Temp(Src) 97.8 F (36.6 C) (Oral)   Wt 170 lb (77.111 kg)   SpO2 99% BP was not re-checked.  General:  WDWN flushed pleasant woman, alert, cooperative and in no apparent distress     Lab results: Hct 39.3  Iron 198 (nl 28-170)    ASSESSMENT/PLAN:    280.9AM Iron deficiency anemia  (primary  encounter diagnosis)  Comment: resolved  Plan: d/c iron supplements    V76.91F Screening for colon cancer  Comment: overdue  Plan: REFERRAL TO OPEN ACCESS COLONOSCOPY             follow up March 2012 for PEx      Current outpatient prescriptions:  loratadine (CLARITIN) 10 MG tablet Take 1 tablet by mouth daily. Disp: 30 tablet Rfl: 6   Calcium Carbonate-Vitamin D (CALCIUM 600+D) 600-400 MG-UNIT TABS Take 1 tablet by mouth 2 (two) times daily with meals. Disp: 100 tablet Rfl: 10   ferrous gluconate (FERGON) 325 MG tablet Take 1 tablet by mouth daily with breakfast. Disp: 100 tablet Rfl: 3   lansoprazole (PREVACID) 15 MG capsule Take 1 capsule by mouth daily. Disp: 30 capsule Rfl: 12   ibuprofen (ADVIL,MOTRIN) 600 MG tablet Take 1 tablet by mouth every 8 (eight) hours as needed for Pain. Take medication with food Disp: 60 tablet Rfl: 1         I have reviewed the past medical, surgical, social and family history and updated these sections of EpicCare as relevant. All interim labs, test results, and consult notes were reviewed and discussed with Deborah Hesselbach  Mathis. Medications were reconciled during this visit and a current medication list was given to the patient at the end of the visit.

## 2010-08-09 ENCOUNTER — Other Ambulatory Visit (HOSPITAL_BASED_OUTPATIENT_CLINIC_OR_DEPARTMENT_OTHER): Payer: Self-pay

## 2010-08-09 DIAGNOSIS — Z1211 Encounter for screening for malignant neoplasm of colon: Secondary | ICD-10-CM

## 2010-08-09 NOTE — Telephone Encounter (Signed)
Spoke with patient regarding scheduling colonoscopy. Procedure/preparation/need for ride explained. Assessment completed. Patient scheduled for a colonoscopy  With Dr Suzie Portela 09/14/10 at 0840

## 2010-08-09 NOTE — Patient Instructions (Signed)
Verbal instructions given over phone. Written instructions sent to patient in mail. Script sent to pharmacy.

## 2010-08-15 MED ORDER — PEG 3350-KCL-NA BICARB-NACL 420 G PO SOLR
4000.00 mL | ORAL | Status: AC
Start: 2010-08-09 — End: 2010-10-08

## 2010-09-12 ENCOUNTER — Telehealth (HOSPITAL_BASED_OUTPATIENT_CLINIC_OR_DEPARTMENT_OTHER): Payer: Self-pay

## 2010-10-12 ENCOUNTER — Ambulatory Visit (HOSPITAL_BASED_OUTPATIENT_CLINIC_OR_DEPARTMENT_OTHER)
Admit: 2010-10-12 | Disposition: A | Discharge status: Home / Self Care | Payer: Self-pay | Source: Ambulatory Visit | Admitting: Gastroenterology

## 2010-10-12 LAB — CBC, PLATELET & DIFFERENTIAL
BASOPHIL %: 0.6 % (ref 0.0–2.0)
EOSINOPHIL %: 2.1 % (ref 0.0–7.0)
HEMATOCRIT: 37.5 % (ref 36.0–48.0)
HEMOGLOBIN: 12.5 g/dl (ref 12.0–16.0)
LYMPHOCYTE %: 26.3 % (ref 13.0–39.0)
MEAN CORP HGB CONC: 33.4 g/dl (ref 32.0–36.0)
MEAN CORPUSCULAR HGB: 27.6 pg (ref 27.0–33.0)
MEAN CORPUSCULAR VOL: 82.7 fl (ref 80.0–100.0)
MEAN PLATELET VOLUME: 12.1 fl — ABNORMAL HIGH (ref 6.4–10.8)
MONOCYTE %: 6.7 % (ref 1.0–12.0)
NEUTROPHIL %: 64.3 % (ref 46.0–79.0)
PLATELET COUNT: 133 10*3/uL — ABNORMAL LOW (ref 150–400)
RBC DISTRIBUTION WIDTH: 12.3 % (ref 11.5–14.3)
RED BLOOD CELL COUNT: 4.53 M/uL (ref 4.50–5.10)
WHITE BLOOD CELL COUNT: 4.3 10*3/uL (ref 4.0–10.8)

## 2010-10-12 LAB — CHG SEDIMENTATION RATE RBC NON-AUTOMATED: RBC SEDIMENTATION RATE: 11 MM/HR (ref 0–15)

## 2010-10-12 MED ORDER — FENTANYL CITRATE 0.05 MG/ML IJ SOLN
INTRAMUSCULAR | Status: AC
Start: 2010-10-12 — End: 2010-10-12
  Administered 2010-10-12: 125 ug via INTRAVENOUS
  Filled 2010-10-12: qty 4

## 2010-10-12 MED ORDER — MIDAZOLAM HCL 2 MG/2ML IJ SOLN
INTRAMUSCULAR | Status: AC
Start: 2010-10-12 — End: 2010-10-12
  Administered 2010-10-12: 5 mg via INTRAVENOUS
  Filled 2010-10-12: qty 8

## 2010-10-13 NOTE — Progress Notes (Signed)
Quick Note:    Patient had ileitis on her colonoscopy biopsies. Slightly low plt count. ESR is not elevated.  ______

## 2010-10-15 ENCOUNTER — Other Ambulatory Visit (HOSPITAL_BASED_OUTPATIENT_CLINIC_OR_DEPARTMENT_OTHER): Payer: Self-pay | Admitting: Gastroenterology

## 2010-10-16 LAB — CHG IRON BINDING CAPACITY: TOTAL IRON BIND CAPACITY CALC: 405 ug/dL (ref 282–427)

## 2010-10-16 LAB — IRON: IRON: 81 ug/dl (ref 28–170)

## 2010-10-16 LAB — CHG ASSAY OF FERRITIN: FERRITIN: 15 ng/ml (ref 11–307)

## 2010-10-16 LAB — SURGICAL PATH SPECIMEN

## 2010-10-17 ENCOUNTER — Ambulatory Visit (HOSPITAL_BASED_OUTPATIENT_CLINIC_OR_DEPARTMENT_OTHER): Payer: BLUE CROSS/BLUE SHIELD | Admitting: Gastroenterology

## 2010-10-17 VITALS — BP 157/91 | HR 92 | Temp 97.8°F | Resp 16 | Ht 61.0 in | Wt 169.0 lb

## 2010-10-17 DIAGNOSIS — K529 Noninfective gastroenteritis and colitis, unspecified: Secondary | ICD-10-CM

## 2010-10-17 DIAGNOSIS — D509 Iron deficiency anemia, unspecified: Secondary | ICD-10-CM

## 2010-10-17 NOTE — Progress Notes (Signed)
This 54 year old English speaking patient presents for follow up of his history of ileitis on a screening colonoscopy.    Please see the dictated history. The patient notes she uses Motrin.    Past Medical History  Patient Active Problem List:     CARPAL TUNNEL SYNDROME [354.0]     TUBERCULIN TEST REACTION NO TBC [795.5]     Iron deficiency anemia [280.9AM]     Uterine fibroid [218.9K]     Hydradenitis [705.61F]     Seasonal allergies [477.9U]      Review of Patient's Allergies indicates:  No Known Allergies    Social Hx    Smoking Status: Never Smoker                      Alcohol Use: No                Current outpatient prescriptions:  Calcium Carbonate-Vitamin D (CALCIUM 600+D) 600-400 MG-UNIT TABS Take 1 tablet by mouth 2 (two) times daily with meals. Disp: 100 tablet Rfl: 10   lansoprazole (PREVACID) 15 MG capsule Take 1 capsule by mouth daily. Disp: 30 capsule Rfl: 12         Family Hx    Family History    Heart Brother     Comment: deceased    Heart Brother     Heart Father     Comment: deceased    Heart Mother     Diabetes FamHxNeg     Cancer - Prostate Brother          REVIEW OF SYSTEMS:    Cardiovascular:  No chest pain, palpitations, MI or heart failure.  Pulmonary:  No pneumonia, TB or asthma.    Neuro:  No stroke, seizure or loss of consciousness.    Endocrine:  No diabetes or thyroid disease.      Physical Exam:  Vital Signs: There were no vitals taken for this visit.   General: Normal body habitus.   HEENT: Normocephalic, atrumatic, PERRLA, Extra occular motions intact. No scleral icterus. Pharynx benign. Tongue midline.  LN: Without cervical, supraclavicular or infraclavicular lymphadenopathy.  Pulmonary: Clear to auscultation and percussion. No rales, wheezes or rhonchi.  Cardiovascular: S1, S2, no S3 or murmurs.  JVD not elevated with patient sitting.  Abdominal: Normal bowel sounds. No tenderness on light or deep palpation. No hepatomegaly or splenomegaly.  Neurological: Sensation  intact. Normal motor function. Normal gait.     Pertinent Labs:  Path from 10/15/10:   - ACUTE TERMINAL ILEITIS, WITH FOCAL ULCERATION AND   UNDERLYING GRANULATION TISSUE. SEE NOTE.      Note: The terminal ileal biopsy shows preservation of   villous architecture, except for the area of marked acute   and chronic inflammation in the lamina propria. In addition,   there is focal acute cryptitis and superficial epithelial   acute inflammation. Mild crypt irregularity is noted. No   crypt abscesses or granulomas are identified. No viral   inclusions are identified. These histopathologic changes may   be seen in a variety of conditions including early   inflammatory bowel disease and NSAID associated ulceration.   Clinical correlation is required.      COLON (ASCENDING COLON), BIOPSY:   - THERE IS NO EVIDENCE OF ACUTE AND OR CHRONIC COLITIS.   - THERE IS NO EVIDENCE OF MICROSCOPIC OR COLLAGENOUS   COLITIS.   - FRAGMENTS OF COLONIC MUCOSA WITH LYMPHOID AGGREGATES, NO   DIAGNOSTIC ABNORMALITY RECOGNIZED  COLON (RECTUM) BIOPSY:   - THERE IS NO EVIDENCE OF ACUTE AND OR CHRONIC COLITIS.   - THERE IS NO EVIDENCE OF MICROSCOPIC OR COLLAGENOUS   COLITIS.   - FRAGMENTS OF COLONIC MUCOSA WITH FOCAL PROBABLE   MUCIPHAGES, NO DIAGNOSTIC ABNORMALITY RECOGNIZED.        WHITE BLOOD CELL (TH/uL)   Date     Date  Value    10/12/2010  4.3    ----------    RED BLOOD CELL COUNT (M/uL)   Date     Date  Value    10/12/2010  4.53    ----------    HEMOGLOBIN (g/dl)   Date     Date  Value    10/12/2010  12.5    ----------      HEMATOCRIT (%)   Date     Date  Value    10/12/2010  37.5    ----------    MEAN CORPUSCULAR VOL (fl)   Date     Date  Value    10/12/2010  82.7    ----------    MEAN CORPUSCULAR HGB (pg)   Date     Date  Value    10/12/2010  27.6    ----------    MEAN CORP HGB CONC (g/dl)   Date     Date  Value    10/12/2010  33.4    ----------    RBC DISTRIBUTION WIDTH (%)   Date     Date  Value    10/12/2010  12.3    ----------    PLATELET  COUNT (TH/uL)   Date     Date  Value    10/12/2010  133*   ----------      RBC SEDIMENTATION RATE (MM/HR)   Date     Date  Value    10/12/2010  11    ----------      Impression:  558.9Z Ileitis  (primary encounter diagnosis)  280.9AM Iron deficiency anemia    Medical Decision Making:  Please see dictation. Over 15 minutes spent with the patient, more than half of which were in counseling about the noted issues.     Reviewed with patient.

## 2010-10-18 LAB — YERSINIA CULTURE

## 2010-10-19 LAB — STRONGYLOIDES ANTIBODY IGG: STRONGYLOIDES ANTIBODY IGG: 0.04 IV (ref <=1.49)

## 2010-10-19 LAB — SCHISTOSOMIASIS AB: SCHISTOSOMIASIS ANTIBODY: 0.06 OD (ref 0.00–0.19)

## 2010-10-21 LAB — GI CENTER CLINIC NOTE

## 2010-10-31 LAB — GI OPERATIVE NOTE

## 2010-11-28 ENCOUNTER — Ambulatory Visit (HOSPITAL_BASED_OUTPATIENT_CLINIC_OR_DEPARTMENT_OTHER): Payer: BLUE CROSS/BLUE SHIELD | Admitting: Internal Medicine

## 2010-11-28 VITALS — BP 128/86 | HR 74 | Temp 97.8°F | Wt 170.0 lb

## 2010-11-28 DIAGNOSIS — J029 Acute pharyngitis, unspecified: Secondary | ICD-10-CM

## 2010-11-28 DIAGNOSIS — J209 Acute bronchitis, unspecified: Secondary | ICD-10-CM

## 2010-11-28 LAB — RAPID STREP (POINT OF CARE): STREP SCREEN: NEGATIVE

## 2010-11-28 MED ORDER — ACETAMINOPHEN-CODEINE #3 300-30 MG PO TABS
1.0000 | ORAL_TABLET | ORAL | Status: AC | PRN
Start: 2010-11-28 — End: 2010-12-12

## 2010-11-28 NOTE — Progress Notes (Signed)
Hx: Deborah Mathis 55 year old female complains of sore throat, head congestion and very persistent coughing, keeping her awake at night.        VS: BP 128/86   Pulse 74   Temp(Src) 97.8 F (36.6 C) (Oral)   Wt 170 lb (77.111 kg)   SpO2 96%  PE:  Appearance: NAD  Skin: no rash  HEENT: TM's pearly with clear landmarks, nasal mucosa normal, pharynx red without exudate.  Neck: supple  LN: no significant cervical nodes, no axillary nodes  Chest: good air entry b/l, no wheezing/rhonchi/crackles, no retractions/flaring  Heart: RRR, no M/R/G    Neuro: awake/alert/MAEE    Impression: Acute Bronchitis, probably viral    Plan:   Tylenol with codeine #3 one every 4 hours for cough suppresion.

## 2010-11-30 LAB — THROAT CULTURE BETA STREP

## 2010-12-06 ENCOUNTER — Ambulatory Visit (HOSPITAL_BASED_OUTPATIENT_CLINIC_OR_DEPARTMENT_OTHER): Payer: BC Managed Care – PPO | Admitting: Gastroenterology

## 2011-03-25 ENCOUNTER — Encounter (HOSPITAL_BASED_OUTPATIENT_CLINIC_OR_DEPARTMENT_OTHER): Payer: Self-pay

## 2011-04-15 ENCOUNTER — Ambulatory Visit (HOSPITAL_BASED_OUTPATIENT_CLINIC_OR_DEPARTMENT_OTHER): Payer: Self-pay

## 2011-04-15 LAB — MA SCREENING MAMMO BILATERAL WITH CAD

## 2011-04-15 NOTE — Progress Notes (Addendum)
Quick Note:    Normal mammogram for tracking.  Thanks,  RB   ______

## 2011-05-03 ENCOUNTER — Encounter (HOSPITAL_BASED_OUTPATIENT_CLINIC_OR_DEPARTMENT_OTHER): Payer: Self-pay

## 2011-05-03 ENCOUNTER — Ambulatory Visit (HOSPITAL_BASED_OUTPATIENT_CLINIC_OR_DEPARTMENT_OTHER): Payer: BLUE CROSS/BLUE SHIELD

## 2011-05-03 VITALS — BP 152/90 | HR 88 | Temp 98.6°F | Ht 60.0 in | Wt 168.0 lb

## 2011-05-03 DIAGNOSIS — K219 Gastro-esophageal reflux disease without esophagitis: Secondary | ICD-10-CM

## 2011-05-03 DIAGNOSIS — M19049 Primary osteoarthritis, unspecified hand: Secondary | ICD-10-CM

## 2011-05-03 DIAGNOSIS — D509 Iron deficiency anemia, unspecified: Secondary | ICD-10-CM

## 2011-05-03 DIAGNOSIS — Z0000 Encounter for general adult medical examination without abnormal findings: Secondary | ICD-10-CM

## 2011-05-03 DIAGNOSIS — Z7189 Other specified counseling: Secondary | ICD-10-CM

## 2011-05-03 DIAGNOSIS — R030 Elevated blood-pressure reading, without diagnosis of hypertension: Secondary | ICD-10-CM

## 2011-05-03 LAB — CBC WITH PLATELET
HEMATOCRIT: 37.2 % (ref 36.0–48.0)
HEMOGLOBIN: 12.8 g/dl (ref 12.0–16.0)
MEAN CORP HGB CONC: 34.4 g/dl (ref 32.0–36.0)
MEAN CORPUSCULAR HGB: 27.1 pg (ref 27.0–33.0)
MEAN CORPUSCULAR VOL: 78.7 fl — ABNORMAL LOW (ref 80.0–100.0)
MEAN PLATELET VOLUME: 11.8 fl — ABNORMAL HIGH (ref 6.4–10.8)
PLATELET COUNT: 192 10*3/uL (ref 150–400)
RBC DISTRIBUTION WIDTH: 14.9 % — ABNORMAL HIGH (ref 11.5–14.3)
RED BLOOD CELL COUNT: 4.73 M/uL (ref 4.50–5.10)
WHITE BLOOD CELL COUNT: 6.3 10*3/uL (ref 4.0–10.8)

## 2011-05-03 MED ORDER — OMEPRAZOLE 20 MG PO CPDR
20.0000 mg | DELAYED_RELEASE_CAPSULE | Freq: Every day | ORAL | Status: DC
Start: 2011-05-03 — End: 2011-12-28

## 2011-05-03 MED ORDER — IBUPROFEN 600 MG PO TABS
600.0000 mg | ORAL_TABLET | Freq: Three times a day (TID) | ORAL | Status: AC | PRN
Start: 2011-05-03 — End: 2011-07-03

## 2011-05-03 NOTE — Progress Notes (Signed)
FEMALE PHYSICAL      SUBJECTIVE:    Deborah Mathis is a 55 year old female who presents for a physical exam.      The patient's current concerns are arthritis in hands. Gets pain after repetitive motion  Heartburn-occurs most days a week, burning sensation in midepigastric area and under sternum. Sometimes bad taste in mouth. may bother her every day. Has been taking OTC prevacid with some relief  Obesity: has started dieting 2 weeks ago; decreased [place] foodd, more veggies, smaller portions. Plans too try walking more and maybe going to gym.  Needs HC proxy.      Patient Active Problem List:     CARPAL TUNNEL SYNDROME [354.0]     TUBERCULIN TEST REACTION NO TBC [795.5]     Iron deficiency anemia [280.9AM]     Uterine fibroid [218.9K]     Hydradenitis [705.45F]     Seasonal allergies [477.9U]      Medication reconciliation performed today.    Social History    Marital Status: Married             Spouse Name:                       Years of Education:                 Number of children:               Social History Main Topics    Smoking Status: Never Smoker                      Alcohol Use: No              Drug Use: No              Sexual Activity: Yes               Partners with: Female    Other Topics            Concern  Military Service        No  Occupational Exposure   No    Comment:repetitive motion hands  Sleep Concern           Yes  Stress Concern          No  Weight Concern          Yes  Special Diet            No    Comment:many vegetables  Exercise                No  Seat Belt               Yes    Social History Narrative    Born: Born [place], came to [place] age 38    Family: lives with husband and son (b. 1991)    Education: has GED    Social: 3 bros and 1 sister living in [place] (one brother dec.d/t CAD)    Occupation:assembly line work    Leisure:          Family History    Heart Brother     Comment: deceased    Heart Brother     Heart Father     Comment: deceased    Heart Mother      Diabetes FamHxNeg     Cancer - Prostate Brother          I  have reviewed the past medical, surgical, social and family history and updated these sections of EpicCare as relevant. All interim labs, test results, and consult notes were reviewed and discussed with Deborah Mathis. Medications were reconciled during this visit and a current medication list was given to the patient at the end of the visit.    REVIEW OF SYSTEMS:  CONSTITUTIONAL:  Denies fatigue, weight change  EYES:  Denies visual changes or pain  ENT:  Denies sore throat, nasal discharge, sinus pain, difficulty hearing.  CARDIOVASCULAR:  Denies chest pain, palpitations or swelling  RESPIRATORY:  Denies cough or shortness of breath  GI:  Denies abdominal pain, nausea, vomiting; no change in bowel habits  MUSCULOSKELETAL:  Denies back pain  SKIN:  No rash or worrisome lesions   NEUROLOGIC:  Denies headache, focal weakness or sensory changes  ENDOCRINE:   No changes in weight or appetite.   GYN: still getting  Menses, occasionally misses one month   PSYCHIATRIC:  Denies depression, anxiety  All systems negative except as marked.    OBJECTIVE:  Vitals:BP 148/82   Pulse 88   Temp(Src) 98.6 F (37 C) (Oral)   Ht 5' (1.524 m)   Wt 168 lb (76.204 kg)   BMI 32.81 kg/m2   SpO2 97% Repeat BP 150/92  Constitutional:   WDWN obese woman, alert, cooperative and in no apparent distress   HENT: Normocephalic, Atraumatic, Bilateral external ears normal, Oropharynx moist, No oral exudates, Nose normal.  Eyes:  PERRL, EOMI, Conjunctiva normal, No discharge.  Neck: Normal range of motion, No tenderness, Supple, No lymphadenopathy,   Thyroid: No thyromegaly, non-tender, no nodules.  Cardiovascular:  Regular rate and rhythm.  No murmurs, clicks, rubs.  No S3, S4.  Pulmonary/Chest:  Normal breath sounds, No respiratory distress, No wheezing, rales,or rhonchi. No chest tenderness  Breasts: Symmetric, No masses or tenderness, No nipple discharge.   Abdomen:  Bowel sounds normal,  soft.  No tenderness, no masses, no hepatosplenomegaly  Back:  No musculoskeletal tenderness, No CVA tenderness  Extremities:  Normal range of motion, Intact distal pulses, No edema, Bilateral hands: tenderness over basilar thumb joint. Heberden's nodes both index fingers DIP joint  Lymphatic:  No lymphadenopathy noted  Neurologic:  Alert & oriented , Normal motor function, Normal sensory function, No focal defecits, normal gait  Skin:  Warm, Dry, No erythema, No rash  Psychiatric:  Affect normal, Judgement normal, Mood normal    ASSESSMENT/PLAN:     V70.0 Routine general medical examination at a health care facility  (primary encounter diagnosis)  Comment: overall in good health, except for obesity and elevated BP  Plan: follow up in one month to recheck her BP    280.9AM Iron deficiency anemia  Comment: need to see if she has maintained her Hct off iron  Plan: CBC + PLT, ROUTINE VENIPUNCTURE          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 recommended one month of PPI to quiet down her symptoms  PLAN: omperazole    follow up one month  Check BP

## 2011-06-17 ENCOUNTER — Ambulatory Visit (HOSPITAL_BASED_OUTPATIENT_CLINIC_OR_DEPARTMENT_OTHER): Payer: BLUE CROSS/BLUE SHIELD

## 2011-06-17 VITALS — BP 138/80 | HR 75 | Temp 97.9°F | Wt 172.5 lb

## 2011-06-17 DIAGNOSIS — S161XXA Strain of muscle, fascia and tendon at neck level, initial encounter: Secondary | ICD-10-CM

## 2011-06-17 DIAGNOSIS — R030 Elevated blood-pressure reading, without diagnosis of hypertension: Secondary | ICD-10-CM

## 2011-06-17 NOTE — Progress Notes (Signed)
SUBJECTIVE:    Deborah Mathis is a 55 year old female presents today for  Blood pressure check.  She feels welland relaxed since her vacation a couple of weeks ago. Has not been stressed, which she states is cause ofh er pressure going up.    Patient Active Problem List:     CARPAL TUNNEL SYNDROME [354.0]     TUBERCULIN TEST REACTION NO TBC [795.5]     Iron deficiency anemia [280.9AM]     Uterine fibroid [218.9K]     Hydradenitis [705.16F]     Seasonal allergies [477.9U]      Medication reconciliation performed today.     Social History Narrative    Born: Born [place], came to [place] age 14    Family: lives with husband and son (b. 1991)    Education: has GED    Social: 3 bros and 1 sister living in [place] (one brother dec.d/t CAD)    Occupation:assembly line work    Leisure:    2012: just completed 20 years at her job          .   Review of Systems: c/o pain in left sideof her neck since waking up two days ago. Thinks she slept in an awkward position; neck isnow painful with looking to the left. Shehas not taken any medication for it.  No chest pain or shortness of breath     OBJECTIVE:  BP 138/80   Pulse 75   Temp(Src) 97.9 F (36.6 C) (Oral)   Wt 172 lb 8 oz (78.245 kg)   SpO2 97% , repeat BP 132/84  General:  WDWN obese woman, alert, cooperative and in no apparent distress rubbing her left neck and shoulder.  NECK: tender lateral neck muscles with spasm in left trapezius. Her ROMis full with some pain at extremes of range.    ASSESSMENT/PLAN:    796.2 Elevated blood pressure reading without diagnosis of hypertension  (primary encounter diagnosis)  Comment: pressureis normotensive today, despite weight being up a pound  Plan: will monitor every 6 months    847.0AK Neck muscle strain  Comment: mild muscle pull  Plan: heat, ibuprofen prn; should resolve in few days       follow up as needed      Current outpatient prescriptions:  ibuprofen (ADVIL,MOTRIN) 600 MG tablet Take 1 tablet by mouth every 8  (eight) hours as needed for Pain. Take medication with food Disp: 60 tablet Rfl: 1   omeprazole (PRILOSEC) 20 MG capsule Take 1 capsule by mouth daily. Disp: 30 capsule Rfl: 2   Calcium Carbonate-Vitamin D (CALCIUM 600+D) 600-400 MG-UNIT TABS Take 1 tablet by mouth 2 (two) times daily with meals. Disp: 100 tablet Rfl: 10         I have reviewed the past medical, surgical, social and family history and updated these sections of EpicCare as relevant. All interim labs, test results, and consult notes were reviewed and discussed with [name]. Medications were reconciled during this visit and a current medication list was given to the patient at the end of the visit.

## 2011-12-28 ENCOUNTER — Other Ambulatory Visit (HOSPITAL_BASED_OUTPATIENT_CLINIC_OR_DEPARTMENT_OTHER): Payer: Self-pay

## 2011-12-29 NOTE — Telephone Encounter (Signed)
Person calling on behalf of patient: Pharmacy    Nazareth Norenberg is a 56 year old female       - medication(s) request: Omeprazole  - last office visit: 06/17/2011  - last physical exam: 05/03/2011      Other Med Adult:  Most Recent BP Reading(s)  06/17/11 : 138/80        Cholesterol (mg/dl)   Date     Date  Value    01/22/2010  206*   ----------    LDL (mg/dl)   Date     Date  Value    01/22/2010  140*   ----------    HDL (mg/dl)   Date     Date  Value    01/22/2010  38    ----------    TRIGLYCERIDE (mg/dl)   Date     Date  Value    01/22/2010  170*   ----------        No results found for this basename: TSHSC        No results found for this basename: TSH      No results found for this basename: hgba1c        No results found for this basename: INR           Documented patient [facility]ies[facility]  [facility] [place] [phone] Fax: [phone]

## 2011-12-30 NOTE — Telephone Encounter (Signed)
Prescription signed and sent electronically to preferred pharmacy.

## 2012-03-27 ENCOUNTER — Encounter (HOSPITAL_BASED_OUTPATIENT_CLINIC_OR_DEPARTMENT_OTHER): Payer: Self-pay

## 2012-04-15 ENCOUNTER — Ambulatory Visit (HOSPITAL_BASED_OUTPATIENT_CLINIC_OR_DEPARTMENT_OTHER): Payer: Self-pay

## 2012-04-16 LAB — MA SCREENING MAMMO BILATERAL WITH CAD

## 2012-05-15 ENCOUNTER — Ambulatory Visit (HOSPITAL_BASED_OUTPATIENT_CLINIC_OR_DEPARTMENT_OTHER): Payer: PRIVATE HEALTH INSURANCE

## 2012-05-15 ENCOUNTER — Encounter (HOSPITAL_BASED_OUTPATIENT_CLINIC_OR_DEPARTMENT_OTHER): Payer: Self-pay

## 2012-05-15 VITALS — BP 144/82 | HR 72 | Temp 98.0°F | Ht 60.0 in | Wt 172.0 lb

## 2012-05-15 DIAGNOSIS — Z0000 Encounter for general adult medical examination without abnormal findings: Secondary | ICD-10-CM

## 2012-05-15 DIAGNOSIS — G5600 Carpal tunnel syndrome, unspecified upper limb: Secondary | ICD-10-CM

## 2012-05-15 DIAGNOSIS — D509 Iron deficiency anemia, unspecified: Secondary | ICD-10-CM

## 2012-05-15 NOTE — Progress Notes (Signed)
FEMALE PHYSICAL      SUBJECTIVE:    Deborah Mathis is a 56 year old female who presents for a physical exam.      The patient's current concerns are :  Menopausal, LMP 11/2011    LOst job; now working [employer] x 6 weeks. 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    Patient Active Problem List:     CARPAL TUNNEL SYNDROME     TUBERCULIN TEST REACTION NO TBC     Iron deficiency anemia     Uterine fibroid     Hydradenitis     Seasonal allergies      Medication reconciliation performed today.    Social History    Marital Status: Married             Spouse Name:                       Years of Education:                 Number of children:               Social History Main Topics    Smoking Status: Never Smoker                      Alcohol Use: No                 Comment: 1 drin every 1-2 montrhs    Drug Use: No              Sexual Activity: Yes               Partners with: Female    Other Topics            Concern  Military Service        No  Occupational Exposure   No    Comment:repetitive motion hands  Sleep Concern           Yes  Stress Concern          No  Weight Concern          Yes  Special Diet            No    Comment:many vegetables  Exercise                No    Comment:walking 90 min, while unemployed  Risk analyst               Yes    Social History Narrative    Born: Born [place], came to [place] age 23    Family: lives with husband and son (b. 1991)    Education: has GED    Social: 3 bros and 1 sister living in [place] (one brother dec.d/t CAD)    Occupation:assembly line work    Leisure:    2012: just completed 20 years at her job        2013:    Lost job x 4 months; now working at [employer].          Family History    Heart Brother     Comment: deceased    Heart Brother     Heart Father     Comment: deceased    Heart Mother     Diabetes FamHxNeg     Cancer - Prostate Brother        I have  reviewed the past medical, surgical, social and family history and updated  these sections of EpicCare as relevant. All interim labs, test results, and consult notes were reviewed and discussed with [name]. Medications were reconciled during this visit and a current medication list was given to the patient at the end of the visit.    REVIEW OF SYSTEMS:  CONSTITUTIONAL:  Denies fatigue, weight change  EYES:  Denies visual changes or pain  ENT:  Denies sore throat, nasal discharge, sinus pain, difficulty hearing.  CARDIOVASCULAR:  Denies chest pain, palpitations or swelling  RESPIRATORY:  Denies cough or shortness of breath  GI:  Denies abdominal pain, nausea, vomiting; no change in bowel habits  MUSCULOSKELETAL:  Denies back pain  SKIN:  No rash or worrisome lesions   NEUROLOGIC:  Denies headache, focal weakness or sensory changes  ENDOCRINE:   No changes in weight or appetite.   GYN: in menopause   LYMPHATIC:  Denies swollen glands  PSYCHIATRIC:  Denies depression, anxiety  All systems negative except as marked.    OBJECTIVE:  Vitals:BP 144/82   Pulse 72   Temp(Src) 98 F (36.7 C) (Oral)   Ht 5' (1.524 m)   Wt 172 lb (78.019 kg)   BMI 33.59 kg/m2   SpO2 97%  Constitutional:   WDWN obese woman, alert, cooperative and in no apparent distress   HENT: Normocephalic, Atraumatic, Bilateral external ears normal, Oropharynx moist, No oral exudates, Nose normal.  Eyes:  PERRL,  Conjunctiva normal, No discharge.  Neck: Normal range of motion, No tenderness, Supple, No lymphadenopathy,   Thyroid: No thyromegaly, non-tender, no nodules.  Cardiovascular:  Regular rate and rhythm.  No murmurs, clicks, rubs.  No S3, S4.  Pulmonary/Chest:  Normal breath sounds, No respiratory distress, No wheezing, rales,or rhonchi. No chest tenderness  Breasts: Symmetric, No masses or tenderness, No nipple discharge.   Abdomen:  Bowel sounds normal, soft.  No tenderness, no masses, no hepatosplenomegaly  Back:  No musculoskeletal tenderness, No CVA tenderness  Extremities:  Normal range of motion, Intact distal  pulses, No edema, No tenderness  Lymphatic:  No lymphadenopathy noted  Neurologic:  Alert & oriented , Normal motor function, Normal sensory function, No focal defecits, normal gait  Skin:  Warm, Dry, No erythema, No rash  Psychiatric:  Affect normal, Judgement normal, Mood normal    ASSESSMENT/PLAN:     (V70.0) Routine general medical examination at a health care facility  (primary encounter diagnosis)  Comment: other than obesity and difficulty fitting in exercixe, she is doing well  Plan: encouraged shorter walks 20 minutes 3-4 days a week    (354.0) Carpal tunnel syndrome  Comment: aparently not affected  By new assembly line work  Plan: monitor    (280.9) Iron deficiency anemia  Comment: no anemia in past couple of years; declines CBC today, but should repeat one.  Plan: recheck CBC, iron studies at next visit.follow up 2-3 months for blood draw

## 2012-05-15 NOTE — Progress Notes (Signed)
Quick Note:    Normal mammogram for tracking.  Thanks,  RB   ______

## 2012-05-26 ENCOUNTER — Ambulatory Visit (HOSPITAL_BASED_OUTPATIENT_CLINIC_OR_DEPARTMENT_OTHER): Payer: PRIVATE HEALTH INSURANCE

## 2012-05-26 VITALS — BP 150/90

## 2012-05-26 DIAGNOSIS — IMO0001 Reserved for inherently not codable concepts without codable children: Secondary | ICD-10-CM

## 2012-05-26 NOTE — Progress Notes (Signed)
Pt presents for BP recheck.  At office visit 7/12 BP was 144/82.  Today 150/90 right arm, 146/88 left arm.  Pt is not on any medication for BP.  F/U per PCP.

## 2012-06-29 ENCOUNTER — Ambulatory Visit (HOSPITAL_BASED_OUTPATIENT_CLINIC_OR_DEPARTMENT_OTHER): Payer: Self-pay

## 2013-03-02 ENCOUNTER — Other Ambulatory Visit (HOSPITAL_BASED_OUTPATIENT_CLINIC_OR_DEPARTMENT_OTHER): Payer: Self-pay

## 2013-03-02 NOTE — Progress Notes (Signed)
Person calling on behalf of patient: Pharmacy    Deborah Mathis is a 57 year old female       - medication(s) request: Omeprazole 20mg  cap  - last office visit: 05/15/12  - last physical exam: 05/15/12      Other Med Adult:  Most Recent BP Reading(s)  05/26/12 : 150/90      Cholesterol (mg/dl)   Date  Value    1/61/0960  206*   ----------  LDL DIRECT (mg/dl)   Date  Value    4/54/0981  140*   ----------  HDL (mg/dl)   Date  Value    1/91/4782  38    ----------  TRIGLYCERIDES (mg/dl)   Date  Value    9/56/2130  170*   ----------      No results found for this basename: TSHSC      No results found for this basename: TSH    No results found for this basename: hgba1c      No results found for this basename: INR           Documented patient preferred pharmacies:    [id number] [place] [place] [facility] [address] AT [facility]  Phone: [phone] Fax: [phone]

## 2013-03-02 NOTE — Telephone Encounter (Signed)
Prescription signed and sent electronically to preferred pharmacy.

## 2013-04-03 ENCOUNTER — Encounter (HOSPITAL_BASED_OUTPATIENT_CLINIC_OR_DEPARTMENT_OTHER): Payer: Self-pay | Admitting: Family Medicine

## 2013-04-03 ENCOUNTER — Ambulatory Visit (HOSPITAL_BASED_OUTPATIENT_CLINIC_OR_DEPARTMENT_OTHER): Payer: PRIVATE HEALTH INSURANCE | Admitting: Family Medicine

## 2013-04-03 VITALS — BP 150/85 | HR 75 | Temp 97.8°F

## 2013-04-03 DIAGNOSIS — H6692 Otitis media, unspecified, left ear: Secondary | ICD-10-CM

## 2013-04-03 MED ORDER — ACETAMINOPHEN-CODEINE #3 300-30 MG PO TABS
1.00 | ORAL_TABLET | Freq: Four times a day (QID) | ORAL | Status: AC | PRN
Start: 2013-04-03 — End: 2013-04-08

## 2013-04-03 MED ORDER — AMOXICILLIN 250 MG PO CAPS
250.0000 mg | ORAL_CAPSULE | Freq: Three times a day (TID) | ORAL | Status: DC
Start: 2013-04-03 — End: 2013-04-14

## 2013-04-03 NOTE — Progress Notes (Signed)
Deborah Mathis is a 57 year old female here for urgent care visit.    URI:  - 3 days  - nasal congestion, sore throat, fatigue  - not getting better  - took tlyenol this morning, tylenol cold and flu and advil  - this morning when woke up left ear started bothering her  - right eye a little red  - thinks 3 nights ago may have had a fever  - a very mild cough  - in past people have given her tylenol # 3 which helps with her cough    Patient Active Problem List:     CARPAL TUNNEL SYNDROME     TUBERCULIN TEST REACTION NO TBC     Iron deficiency anemia     Uterine fibroid     Hydradenitis     Seasonal allergies      OBJECTIVE:  BP 150/85   Pulse 75   Temp(Src) 97.8 F (36.6 C)   SpO2 99%  General appearance: tired appearing  Eyes: watery b/l, right eye slightly erythematous, no discharge    Ears: normal right TM, normal right external ear canal, normal left external ear canal, abnormal left ear TM exam erythematous, bulging, reduced mobility  Nose: clear rhinorrhea  Oropharynx: normal  Neck: normal, supple and no adenopathy  Lungs: chest clear, no wheezing, rales, normal symmetric air entry    ASSESSMENT:  Otitis Media    PLAN:  1) Antibiotics per EpicCare orders.  2) Symptomatic therapy suggested: patient requested tylenol # 3, last prescribed in 2012, no risk of abuse noted  3) Call or return to clinic prn if these symptoms worsen or fail to improve as anticipated.

## 2013-04-08 ENCOUNTER — Encounter (HOSPITAL_BASED_OUTPATIENT_CLINIC_OR_DEPARTMENT_OTHER): Payer: Self-pay

## 2013-04-12 ENCOUNTER — Encounter (HOSPITAL_BASED_OUTPATIENT_CLINIC_OR_DEPARTMENT_OTHER): Payer: Self-pay

## 2013-04-14 ENCOUNTER — Ambulatory Visit (HOSPITAL_BASED_OUTPATIENT_CLINIC_OR_DEPARTMENT_OTHER): Payer: PRIVATE HEALTH INSURANCE | Admitting: Internal Medicine

## 2013-04-14 VITALS — BP 140/83 | HR 79 | Temp 97.6°F | Wt 178.0 lb

## 2013-04-14 DIAGNOSIS — H9202 Otalgia, left ear: Secondary | ICD-10-CM

## 2013-04-14 MED ORDER — AMOXICILLIN 250 MG PO CAPS
250.0000 mg | ORAL_CAPSULE | Freq: Three times a day (TID) | ORAL | Status: AC
Start: 2013-04-14 — End: 2013-04-21

## 2013-04-14 MED ORDER — HYDROCODONE-ACETAMINOPHEN 5-325 MG PO TABS
1.00 | ORAL_TABLET | Freq: Three times a day (TID) | ORAL | Status: AC | PRN
Start: 2013-04-14 — End: 2013-04-24

## 2013-04-14 NOTE — Progress Notes (Signed)
Deborah Mathis is a 57 year old female is here for:     Otalgia of left ear  (primary encounter diagnosis)    Was treated for OM on 5/31 and given amoxil for 7 days which helped. She completed course few days ago but notes still w/ slight left ear pain. No cough or fever/chills. Hurts to swallow a bit, sore and tender on left side of neck/ear.    ROS: She denies a history of hearing loss, tinnitus or aural discharge.    Patient Active Problem List:     CARPAL TUNNEL SYNDROME     TUBERCULIN TEST REACTION NO TBC     Iron deficiency anemia     Uterine fibroid     Hydradenitis     Seasonal allergies      Current Outpatient Prescriptions on File Prior to Visit:  omeprazole (PRILOSEC) 20 MG capsule Take 1 capsule by mouth daily. Disp: 90 capsule Rfl: 3     No current facility-administered medications on file prior to visit.  OBJECTIVE:  BP 140/83   Pulse 79   Temp(Src) 97.6 F (36.4 C) (Temporal)   Wt 178 lb (80.74 kg)   BMI 34.76 kg/m2   SpO2 99%  GENERAL: WD, WN, NAD, A&O x3. Not ill appearing.   HEENT: eomi, perrla, op clear; BOTH EARS are wnl, canal clear w/ pearly TM billaterally. She has some mild pain to tragus palpation on left.   NECK: supple w/out LAD    ASSESSMENT:  (388.70) Otalgia of left ear  (primary encounter diagnosis)  Comment: - recently treated for OM and now w/ residual left ear pain. Ear exam looks normal w/out any erythema.  - advise trial fo pain medication for ear pain. And observe/wait. If still w/ pain beyond 3 days, will have her restart course of abx given she may have been suboptimally dose last time.   Plan: HYDROcodone-acetaminophen (NORCO) 5-325 MG per         tablet, amoxicillin (AMOXIL) 250 MG capsule        -she understands and agrees.     I have reviewed the past medical, surgical, social and family history and updated these sections of EpicCare as relevant. All interim labs, test results, and consult notes were reviewed and discussed with Deborah Mathis. Medications were reconciled  during this visit and a current medication list was given to the patient at the end of the visit.

## 2013-04-28 ENCOUNTER — Ambulatory Visit (HOSPITAL_BASED_OUTPATIENT_CLINIC_OR_DEPARTMENT_OTHER): Payer: Self-pay

## 2014-02-10 ENCOUNTER — Ambulatory Visit (HOSPITAL_BASED_OUTPATIENT_CLINIC_OR_DEPARTMENT_OTHER): Payer: Self-pay | Admitting: Physician Assistant

## 2014-02-23 ENCOUNTER — Ambulatory Visit (HOSPITAL_BASED_OUTPATIENT_CLINIC_OR_DEPARTMENT_OTHER): Payer: BLUE CROSS/BLUE SHIELD | Admitting: Physician Assistant

## 2014-02-23 ENCOUNTER — Encounter (HOSPITAL_BASED_OUTPATIENT_CLINIC_OR_DEPARTMENT_OTHER): Payer: Self-pay | Admitting: Physician Assistant

## 2014-02-23 VITALS — BP 142/83 | HR 63 | Temp 97.9°F | Wt 175.0 lb

## 2014-02-23 DIAGNOSIS — Z13220 Encounter for screening for lipoid disorders: Secondary | ICD-10-CM

## 2014-02-23 DIAGNOSIS — Z0000 Encounter for general adult medical examination without abnormal findings: Secondary | ICD-10-CM

## 2014-02-23 DIAGNOSIS — R05 Cough: Secondary | ICD-10-CM

## 2014-02-23 DIAGNOSIS — Z1331 Encounter for screening for depression: Secondary | ICD-10-CM

## 2014-02-23 DIAGNOSIS — R059 Cough, unspecified: Secondary | ICD-10-CM

## 2014-02-23 DIAGNOSIS — Z1239 Encounter for other screening for malignant neoplasm of breast: Secondary | ICD-10-CM

## 2014-02-23 DIAGNOSIS — D509 Iron deficiency anemia, unspecified: Secondary | ICD-10-CM

## 2014-02-23 LAB — CBC, PLATELET & DIFFERENTIAL
ABSOLUTE BASO COUNT: 0 10*3/uL (ref 0.0–0.1)
ABSOLUTE EOSINOPHIL COUNT: 0.2 10*3/uL (ref 0.0–0.8)
ABSOLUTE IMM GRAN COUNT: 0.01 10*3/uL (ref 0.00–0.03)
ABSOLUTE LYMPH COUNT: 1.6 10*3/uL (ref 0.6–5.9)
ABSOLUTE MONO COUNT: 0.3 10*3/uL (ref 0.2–1.4)
ABSOLUTE NEUTROPHIL COUNT: 2.1 10*3/uL (ref 1.6–8.3)
BASOPHIL %: 0.2 % (ref 0.0–1.2)
EOSINOPHIL %: 4.5 % (ref 0.0–7.0)
HEMATOCRIT: 37.9 % (ref 34.1–44.9)
HEMOGLOBIN: 12.8 g/dL (ref 11.2–15.7)
IMMATURE GRANULOCYTE %: 0.2 % (ref 0.0–0.4)
LYMPHOCYTE %: 37.1 % (ref 15.0–54.0)
MEAN CORP HGB CONC: 33.8 g/dL (ref 31.0–37.0)
MEAN CORPUSCULAR HGB: 26.7 pg (ref 26.0–34.0)
MEAN CORPUSCULAR VOL: 79 fL — ABNORMAL LOW (ref 80.0–100.0)
MEAN PLATELET VOLUME: 12.7 fL — ABNORMAL HIGH (ref 8.7–12.5)
MONOCYTE %: 6.9 % (ref 4.0–13.0)
NEUTROPHIL %: 51.1 % (ref 40.0–75.0)
PLATELET COUNT: 215 10*3/uL (ref 150–400)
RBC DISTRIBUTION WIDTH STD DEV: 37.3 fL (ref 35.1–46.3)
RBC DISTRIBUTION WIDTH: 13 % (ref 11.5–14.3)
RED BLOOD CELL COUNT: 4.8 M/uL (ref 3.90–5.20)
WHITE BLOOD CELL COUNT: 4.2 10*3/uL (ref 4.0–11.0)

## 2014-02-23 LAB — CHOLESTEROL: Cholesterol: 185 mg/dL (ref 0–239)

## 2014-02-23 LAB — HIGH DENSITY LIPOPROTEIN: HIGH DENSITY LIPOPROTEIN: 30 mg/dL — ABNORMAL LOW (ref 40–?)

## 2014-02-23 LAB — LOW DENSITY LIPOPROTEIN DIRECT: LOW DENSITY LIPOPROTEIN DIRECT: 138 mg/dL (ref 0–189)

## 2014-02-23 MED ORDER — ACETAMINOPHEN-CODEINE #3 300-30 MG PO TABS
1.00 | ORAL_TABLET | Freq: Every evening | ORAL | Status: AC | PRN
Start: 2014-02-23 — End: 2014-03-05

## 2014-02-23 NOTE — Progress Notes (Signed)
Chief Complaint:  Deborah Mathis is a 58 year old female who presents for a physical exam.     1.) Would like to lose weight. Used to walk every morning.     Most Recent Weight Reading(s)  02/23/14 : 175 lb (79.379 kg)  04/14/13 : 178 lb (80.74 kg)  05/15/12 : 172 lb (78.019 kg)  06/17/11 : 172 lb 8 oz (78.245 kg)  05/03/11 : 168 lb (76.204 kg)    2.) Would like cholesterol testing.     3.) cough and cold last week. Better but still coughing, keeping her up at night. Has used Tylenol #3 in the past with good nighttime relief.     Patient Active Problem List:     CARPAL TUNNEL SYNDROME     TUBERCULIN TEST REACTION NO TBC     Iron deficiency anemia     Uterine fibroid     Hydradenitis     Seasonal allergies        Current Outpatient Prescriptions:  omeprazole (PRILOSEC) 20 MG capsule Take 1 capsule by mouth daily. Disp: 90 capsule Rfl: 3     No current facility-administered medications for this visit.    Allergies:  Review of Patient's Allergies indicates:  No Known Allergies    Health Maintenance:  Physical Exam (Age 27+) due on 05/15/2013  Phq-9 due on 05/18/2013  Mammography due on 04/16/2014  Flu Vaccine Seasonal due on 07/05/2014  Hpv Screening due on 01/13/2015  Pap Smear due on 01/16/2015  Lipid Screening due on 01/23/2015  Health Care Proxy due on 05/02/2016  Tetanus (16 And Over) due on 01/30/2020  Colonoscopy due on 10/12/2020  Hiv Screening Completed  Hep B High Risk Vaccine Eval (Once) Completed    Immunizations:  Immunization History   Administered Date(s) Administered    None   Deferred Date(s) Deferred    Tdap 01/31/2010       Histories:  History reviewed. No pertinent past medical history.  History reviewed. No pertinent past surgical history.    Social History   Marital Status: Married  Spouse Name: N/A    Years of Education: N/A  Number of Children: N/A     Occupational History  None on file     Social History Main Topics   Smoking status: Never Smoker     Smokeless tobacco: Not on file    Alcohol  Use: No    Comment: 1 drin every 1-2 montrhs    Drug Use: No    Sexual Activity: Yes    Partners: Male     Other Topics Concern    Military Service No    Occupational Exposure No    Comment: repetitive motion hands    Sleep Concern Yes    Stress Concern No    Weight Concern Yes    Special Diet No    Comment: many vegetables    Exercise No    Comment: walking 90 min, while unemployed    Therapist, art Yes     Social History Narrative    Born: Born [place], came to [place] age 68    Family: lives with husband and son (b. 1991)    Education: has GED    Social: 3 bros and 1 sister living in [place] (one brother dec.d/t CAD)    Occupation:assembly line work    Leisure:    2012: just completed 20 years at her job        2013:    [place] job x 4  months; now working at [employer].                       Family History    Heart Brother     Comment: deceased    Heart Brother     Heart Father     Comment: deceased    Heart Mother     Diabetes FamHxNeg     Cancer - Prostate Brother        Review of Systems:                   Skin: negative  Eyes: negative  Ears/Nose/Throat: negative  Respiratory: negative  Cardiovascular: negative  Gastrointestinal: negative  Genitourinary: negative  ZJI:RCVELF menses, no abnormal bleeding, pelvic pain or discharge  no breast pain or new or enlarging lumps on self exam  Musculoskeletal: negative  Neurologic: negative  Endocrine: negative  Psychiatric: negative  Hematologic/Lymphatic/Immunologic: negative    Physical:  BP 142/83   Pulse 63   Temp(Src) 97.9 F (36.6 C) (Temperature probe)   Wt 175 lb (79.379 kg)   BMI 34.18 kg/m2   SpO2 99%  General appearance: healthy, alert, well developed, well nourished  Eyes: conjunctivae/corneas clear. PERRL, EOM's intact.  Skin: skin color, texture, turgor are normal  Head: Normocephalic. No masses, lesions, tenderness or abnormalities  Ears: External ears normal. Canals clear. TM's normal.  Nose/Sinuses: Nares normal. Septum midline. Mucosa normal. No drainage or sinus  tenderness.  Oropharynx: Lips, mucosa, and tongue normal. Teeth and gums normal. Oropharynx moist and without lesion  Neck: Neck supple. No adenopathy. Thyroid symmetric, normal size, and without nodularity  Back: Back symmetric, no curvature. ROM normal. No CVA tenderness.  Lungs: Lungs clear to auscultation bilaterally  Heart: RRR. No murmurs, clicks, gallops or rubs  Abdomen: Abdomen soft, non-tender. BS normal. No masses, no organomegaly  Extremities: Extremities normal. No deformities, edema, or skin discoloration  Musculoskeletal: Spine ROM normal. Muscular strength intact.  Peripheral pulses: radial=4/4, dorsalis pedis=4/4  Neuro: Gait normal. Reflexes normal and symmetric. Sensation grossly normal     Health Counseling:  Smoking:  Reviewed and Discussed and no  Substance Use Issues:   Reviewed and Discussed and no   Seat Belts:  Reviewed and Discussed and yes  Smoke Dectectors:  Reviewed and Discussed and yes  Diet, Exercise, Wt. Control:  Reviewed and Discussed and long discussion  Dental Health:  Reviewed and Discussed and UTD  Vision Health:  Reviewed and Discussed and no issues with vision  Mental Health:  Reviewed and Discussed and no concerns  Domestic Violence:  Reviewed and Discussed and no  Sexual Health:  Reviewed and Discussed and okay  BSE:  Reviewed and Discussed and mammogram ordered  Osteoperosis: Reviewed and Discussed and discussed.   Health Care Proxy:  Reviewed and Discussed and UTD.    ASSESSMENT/PLAN:  (V70.0) Routine general medical examination at a health care facility  (primary encounter diagnosis)  Comment: Generally healthy 58 year old female.   Plan: Discussed age-appropriate HM, calcium/VitD, post-menopausal changes, diet, exercise.     (786.2) Cough  Comment: Advised patient to increase fluids and rest. Saline nasal spray/wash if congested. Steam, humidifier, elevate head of bed, teaspoon of honey. Can try OTC Robitussin or Delsym during the day, T#3 prescribed for nighttime PRN  use (#10 tablets). Discussed use and SE's, including sedating nature of medication and not for long-term use. Advised patient to RTC if fevers, chills, SOB, wheeze, dyspnea, chest pain, or new/worsening  symptoms.   Plan: acetaminophen-codeine (TYLENOL #3) 300-30 MG         per tablet    (280.9) Iron deficiency anemia  Comment: History of this during menopause. Will check today. Does not take iron.   Plan: COLLECTION VENOUS BLOOD VENIPUNCTURE, CBC + PLT        + AUTO DIFF    (V77.91) Screening cholesterol level  Comment: Patient would like to know what her level is.   Plan: COLLECTION VENOUS BLOOD VENIPUNCTURE,         CHOLESTEROL, HIGH DENSITY LIPOPROTEIN, LOW         DENSITY LIPOPROTEIN,DIRECT    (V76.10) Screening for malignant neoplasm of breast  Comment: Health maintenance due. Completed today.   Plan: [facility] MAMMOGRAPHY SCREENING BILATERAL W CAD    (V79.0) Screening for depression  Comment: PHQ9: 2  Plan: Negative screen.     I have reviewed the past medical, surgical, social and family history and updated these sections of EpicCare as relevant. All interim labs, test results, and consult notes were reviewed and discussed with the patient. Medications were reconciled during this visit and a current medication list was given to the patient at the end of the visit.  Signed by Shauna Hugh, PA-C

## 2014-02-23 NOTE — Patient Instructions (Signed)
[facility]  [facility]  [address]  [place] [address]  Clinical Nutrition Services    Calcium and Your Health    Calcium is a mineral that helps build strong bones and teeth, helps prevent brittle bones (osteoporosis) and hip fractures, and helps maintain a normal blood pressure. It is very important to get enough calcium each day.    * CALCIUM NEEDS AT ALL AGES: (RDI 2000)  Infants birth to 12 months  is 210 -270 mg/day          Child 1-8 years  is 500-.800 mg/day                     Adolescent 9-18years is 1300 mg/day                   Adults 19-30 years is 1000 mg/day  Adults 31-50 years is 1000 mg/day  Adults 51 and older is 1200 mg/day    * TO MAINTAIN GOOD BONE HEALTH, EAT A CALCIUM RICH DIET:    FOODS                                      Yogurt, plain, nonfat: 1 cup serving is 450 mgs of calcium    Ricotta cheese, part skim: 1/2 cup serving is 340 mgs of calcium    Milk, skim: 1 cup serving is 300 mgs of calcium    Milk, whole: 1 cup serving is 290 mgs of calcium    Sardines, canned, with bones: 3 oz serving is 280 mgs of calcium    Yogurt, plain, whole milk:1 cup serving is 275 mgs of calcium    Orange juice, calcium fortified:1 cup serving is 270 mgs of calcium    Swiss cheese: 1 oz serving is 270 mgs of calcium    Spinach, cooked: 1 cup serving is 240 mgs of calcium   Turnip greens, rhubarb, cooked:1 cup serving is 200 mgs of calcium   Salmon, canned, with bones: 3 oz serving is 180 mgs of calcium   Ice Cream: 1 cup serving is 175 mgs of calcium   Pudding, instant mix: 1/2 cup serving is 150 mgs of calcium   Almonds:  /2 cup serving is 150 mgs of calcium   Kale, cooked: 1 cup serving is 100 mgs of calcium   Lobster, cooked: 6 oz serving is 100 mgs of calcium   Tofu, with calcium sulfate:  3oz serving is 100 mgs of calcium     EXERCISE: Weight bearing exercises, such as walking, jogging, racquet sports, aerobics, and weight training  help make bones stronger.     VITAMIN D:  Vitamin D is essential to help your body absorb calcium. A healthy body can make its own vitamin D with the help of sunlight. But in the winter months, you may not get enough sun, and should make   sure you get it from another source, like milk or a multivitamin.     HORMONES: The hormone estrogen helps the female body and bones to use calcium. After menopause women need to discuss hormone treatment with their doctor.    * This is just a first step in helping improve your health.  * For help with a meal plan for you, make an appointment with the Nutritionist.  * For more information, you can also contact the National Nutrition Hotline 1-800-366-1655                                                                9/95 MAS 1/96, 12/97eqj, 01/02, 4/02

## 2014-02-24 ENCOUNTER — Encounter (HOSPITAL_BASED_OUTPATIENT_CLINIC_OR_DEPARTMENT_OTHER): Payer: Self-pay | Admitting: Physician Assistant

## 2014-03-09 ENCOUNTER — Ambulatory Visit (HOSPITAL_BASED_OUTPATIENT_CLINIC_OR_DEPARTMENT_OTHER): Payer: Self-pay

## 2014-03-09 DIAGNOSIS — Z1239 Encounter for other screening for malignant neoplasm of breast: Secondary | ICD-10-CM

## 2014-03-09 NOTE — Addendum Note (Signed)
Addended by: Jilda Roche on: 03/09/2014 05:51 PM     Modules accepted: Orders

## 2014-03-10 LAB — MA SCREENING MAMMO BILATERAL WITH CAD

## 2014-04-20 ENCOUNTER — Other Ambulatory Visit (HOSPITAL_BASED_OUTPATIENT_CLINIC_OR_DEPARTMENT_OTHER): Payer: Self-pay

## 2014-04-21 NOTE — Progress Notes (Signed)
PER Pharmacy, Deborah Mathis is a 58 year old female has requested a refill of omeprazole 20mg .      Last Office Visit: 02-23-14  Last Physical Exam: 02-23-14      Other Med Adult:  Most Recent BP Reading(s)  02/23/14 : 142/83        Cholesterol (mg/dL)   Date  Value    02/23/2014  185    ----------    LDL DIRECT (mg/dL)   Date  Value    02/23/2014  138    ----------    HDL (mg/dL)   Date  Value    02/23/2014  30*   ----------    TRIGLYCERIDES (mg/dl)   Date  Value    01/22/2010  170*   ----------        No results found for this basename: TSHSC        No results found for this basename: TSH      No results found for this basename: hgba1c        No results found for this basename: INR       Documented patient preferred pharmacies:    [id number] - [place], [place] - [place]  Phone: [phone] Fax: [phone]

## 2014-04-28 ENCOUNTER — Other Ambulatory Visit (HOSPITAL_BASED_OUTPATIENT_CLINIC_OR_DEPARTMENT_OTHER): Payer: Self-pay

## 2014-04-29 ENCOUNTER — Telehealth (HOSPITAL_BASED_OUTPATIENT_CLINIC_OR_DEPARTMENT_OTHER): Payer: Self-pay

## 2014-04-29 NOTE — Progress Notes (Signed)
Message left for patient to return my call.  

## 2014-04-29 NOTE — Progress Notes (Signed)
Situation: the patient requests refill of omeprazole, but no clear indication currently  Background:  No documented GERD or PUD  Assessment:  Taper off PPI; may use prn x 3 months  REQUEST/PLAN:   Lorraine, Pls contact pt, I have decreased her omeprazole from one daily to one cap only as needed for reflux symptoms as currently no current indication. Rxed 45 pilsl for 90 days. Plan to reevaluate in 3 months. Thanks, RB

## 2014-04-29 NOTE — Progress Notes (Signed)
Deborah Mathis, Pls contact pt, I have decreased her omeprazole from one daily to one cap only as needed for reflux symptoms as currently no current indication. Rxed 45 pilsl for 90 days. Plan to reevaluate in 3 months. Thanks, RB

## 2014-04-29 NOTE — Progress Notes (Signed)
PER Pharmacy, Deborah Mathis is a 58 year old female has requested a refill of omeprazole 20mg .      Last Office Visit: 02-23-14  Last Physical Exam: 02-23-14      Other Med Adult:  Most Recent BP Reading(s)  02/23/14 : 142/83        Cholesterol (mg/dL)   Date  Value    02/23/2014  185    ----------    LDL DIRECT (mg/dL)   Date  Value    02/23/2014  138    ----------    HDL (mg/dL)   Date  Value    02/23/2014  30*   ----------    TRIGLYCERIDES (mg/dl)   Date  Value    01/22/2010  170*   ----------        No results found for this basename: TSHSC        No results found for this basename: TSH      No results found for this basename: hgba1c        No results found for this basename: INR       Documented patient preferred pharmacies:    [id number] [place] [place]  Phone: [phone] Fax: [phone]

## 2014-05-27 ENCOUNTER — Other Ambulatory Visit (HOSPITAL_BASED_OUTPATIENT_CLINIC_OR_DEPARTMENT_OTHER): Payer: Self-pay

## 2014-05-27 NOTE — Progress Notes (Signed)
PER Pharmacy, Deborah Mathis ,a 58 year old female,has requested a refill of omeprazole 20mg .    This medication has been reordered with the same quantity and end date as the last time it was prescribed.    Last Office Visit: 02-23-14  Last Physical Exam: 02-23-14      Other Med Adult:  Most Recent BP Reading(s)  02/23/14 : 142/83        Cholesterol (mg/dL)   Date  Value    02/23/2014  185    ----------    LOW DENSITY LIPOPROTEIN DIRECT (mg/dL)   Date  Value    02/23/2014  138    ----------    HIGH DENSITY LIPOPROTEIN (mg/dL)   Date  Value    02/23/2014  30*   ----------    TRIGLYCERIDES (mg/dl)   Date  Value    01/22/2010  170*   ----------        No results found for this basename: TSHSC        No results found for this basename: TSH      No results found for this basename: hgba1c        No results found for this basename: INR       Documented patient preferred pharmacies:    [id number] - [place], [place] - [place]  Phone: [phone] Fax: [phone]

## 2014-10-05 ENCOUNTER — Other Ambulatory Visit (HOSPITAL_BASED_OUTPATIENT_CLINIC_OR_DEPARTMENT_OTHER): Payer: Self-pay | Admitting: Internal Medicine

## 2014-10-05 NOTE — Progress Notes (Unsigned)
PER Pharmacy, Deborah Mathis is a 58 year old female has requested a refill of OMEPRAZOLE 20MG .      Last Office Visit: 02/23/2014  Last Physical Exam: 02/23/2014      Other Med Adult:  Most Recent BP Reading(s)  02/23/14 : 142/83        CHOLESTEROL (mg/dL)   Date Value   02/23/2014 185   ----------    LOW DENSITY LIPOPROTEIN DIRECT (mg/dL)   Date Value   02/23/2014 138   ----------    HIGH DENSITY LIPOPROTEIN (mg/dL)   Date Value   02/23/2014 30*   ----------    TRIGLYCERIDES (mg/dl)   Date Value   01/22/2010 170*   ----------      No results found for: TSHSC      No results found for: TSH    No results found for: HGBA1C      No results found for: INR       Documented patient preferred pharmacies:    [id number] - [place], [place] - [place]  Phone: [phone] Fax: [phone]

## 2014-10-06 ENCOUNTER — Other Ambulatory Visit (HOSPITAL_BASED_OUTPATIENT_CLINIC_OR_DEPARTMENT_OTHER): Payer: Self-pay

## 2014-10-06 DIAGNOSIS — K2100 Gastro-esophageal reflux disease with esophagitis, without bleeding: Secondary | ICD-10-CM

## 2014-10-06 MED ORDER — OMEPRAZOLE 20 MG PO CPDR
20.0000 mg | DELAYED_RELEASE_CAPSULE | Freq: Every day | ORAL | Status: DC | PRN
Start: 2014-10-06 — End: 2014-11-04

## 2014-10-10 ENCOUNTER — Ambulatory Visit (HOSPITAL_BASED_OUTPATIENT_CLINIC_OR_DEPARTMENT_OTHER): Payer: BLUE CROSS/BLUE SHIELD | Admitting: Internal Medicine

## 2014-10-10 ENCOUNTER — Encounter (HOSPITAL_BASED_OUTPATIENT_CLINIC_OR_DEPARTMENT_OTHER): Payer: Self-pay | Admitting: Internal Medicine

## 2014-10-10 VITALS — BP 147/73 | HR 68 | Temp 98.1°F | Wt 180.0 lb

## 2014-10-10 DIAGNOSIS — M79642 Pain in left hand: Secondary | ICD-10-CM

## 2014-10-10 DIAGNOSIS — R05 Cough: Secondary | ICD-10-CM

## 2014-10-10 DIAGNOSIS — R059 Cough, unspecified: Secondary | ICD-10-CM

## 2014-10-10 DIAGNOSIS — M79641 Pain in right hand: Secondary | ICD-10-CM

## 2014-10-10 DIAGNOSIS — J069 Acute upper respiratory infection, unspecified: Secondary | ICD-10-CM

## 2014-10-10 DIAGNOSIS — IMO0001 Reserved for inherently not codable concepts without codable children: Secondary | ICD-10-CM

## 2014-10-10 MED ORDER — ACETAMINOPHEN-CODEINE #3 300-30 MG PO TABS
1.0000 | ORAL_TABLET | Freq: Two times a day (BID) | ORAL | Status: DC | PRN
Start: 2014-10-10 — End: 2015-02-28

## 2014-10-10 NOTE — Progress Notes (Signed)
Deborah Mathis is a 58 year old female  Horrible cough for last 3 weeks   Slightly better for last few days but still can't sleep at night because of the cough   No SOB, no chest pain   No more phlegm production   No wheezing  No fever  No sal congestion   Delsym, Nyquil    Also concerns about hand pain and fingers disfiguration. Was told in the pastthat it is arthritis   Elevated BP today. Pt got a news about sudden death of cousin today   OBJECTIVE:  BP 147/73 mmHg   Pulse 68   Temp(Src) 98.1 F (36.7 C) (Temporal)   Wt 81.647 kg (180 lb)   SpO2 100%  Appearance: NAD  Skin: no rash  HEENT: TM's pearly with clear landmarks, nasal mucosa congested, pharynx normal  Neck: supple  LN: no significant cervical nodes  Chest: good air entry b/l, no wheezing/rhonchi/crackles, no retractions/flaring  Heart: RRR, no M/R/G  Musculoskeletal: a few fingers with " neck swan " deformities. FROM . Equal muslce tone and strength of hands B/L  Assessment:  (R05) Cough  (primary encounter diagnosis)  Comment:  causing pt's discomfort  Plan: Symptomatic therapy suggested: push fluids, rest, gargle warm salt water and apply heat to sinuses prn. Steam inhalation. Nasal wash with use of saline spray. Robitussin DM prn      (J06.9) Acute upper respiratory infection  Comment: causing pt's symptoms  Plan: Symptomatic therapy suggested: push fluids, rest, gargle warm salt water and apply heat to sinuses prn. Steam inhalation. Nasal wash with use of saline spray. Robitussin DM prn      (M79.641,  M79.642) Pain in both hands  Comment: trial of medication for pain additionally to Advil   Plan: acetaminophen-codeine (TYLENOL #3) 300-30 MG         per tablet            (I10) Elevated blood pressure  Comment: emotional response?  Plan: monitoring . recommended to return back in a month or so with BP log    Fu in 1 months or prn

## 2014-11-04 ENCOUNTER — Other Ambulatory Visit (HOSPITAL_BASED_OUTPATIENT_CLINIC_OR_DEPARTMENT_OTHER): Payer: Self-pay

## 2014-11-05 NOTE — Progress Notes (Signed)
[facility] Pharmacy, Deborah Mathis is a 59 year old female has requested a refill of Omeprazole 20 mg.      Last Office Visit: 10/10/14  Last Physical Exam: 02/23/14      Other Med Adult:  Most Recent BP Reading(s)  10/10/14 : 147/73        CHOLESTEROL (mg/dL)   Date Value   02/23/2014 185   ----------    LOW DENSITY LIPOPROTEIN DIRECT (mg/dL)   Date Value   02/23/2014 138   ----------    HIGH DENSITY LIPOPROTEIN (mg/dL)   Date Value   02/23/2014 30*   ----------    TRIGLYCERIDES (mg/dl)   Date Value   01/22/2010 170*   ----------      No results found for: TSHSC      No results found for: TSH    No results found for: HGBA1C      No results found for: INR       Documented patient preferred pharmacies:    [id number] - [place], [place] - [place]  Phone: [phone] Fax: [phone]

## 2014-11-14 ENCOUNTER — Ambulatory Visit (HOSPITAL_BASED_OUTPATIENT_CLINIC_OR_DEPARTMENT_OTHER): Payer: BLUE CROSS/BLUE SHIELD

## 2014-11-14 ENCOUNTER — Encounter (HOSPITAL_BASED_OUTPATIENT_CLINIC_OR_DEPARTMENT_OTHER): Payer: Self-pay

## 2014-11-14 VITALS — BP 153/78 | HR 82 | Temp 97.3°F

## 2014-11-14 DIAGNOSIS — B349 Viral infection, unspecified: Secondary | ICD-10-CM

## 2014-11-14 DIAGNOSIS — I10 Essential (primary) hypertension: Secondary | ICD-10-CM

## 2014-11-14 LAB — COMP METABOLIC PANEL, FASTING
ALANINE AMINOTRANSFERASE: 52 U/L — ABNORMAL HIGH (ref 12–45)
ALBUMIN: 4.2 g/dL (ref 3.4–5.0)
ALKALINE PHOSPHATASE: 91 U/L (ref 45–117)
ANION GAP: 8 mmol/L (ref 5–15)
ASPARTATE AMINOTRANSFERASE: 25 U/L (ref 8–34)
BILIRUBIN TOTAL: 0.9 mg/dL (ref 0.2–1.0)
BUN (UREA NITROGEN): 16 mg/dL (ref 7–18)
CALCIUM: 8.4 mg/dL — ABNORMAL LOW (ref 8.5–10.1)
CARBON DIOXIDE: 27 mmol/L (ref 21–32)
CHLORIDE: 106 mmol/L (ref 98–107)
CREATININE: 0.7 mg/dL (ref 0.4–1.2)
ESTIMATED GLOMERULAR FILT RATE: >60 mL/min (ref >60)
GLUCOSE FASTING: 95 mg/dL (ref 74–106)
POTASSIUM: 4 mmol/L (ref 3.5–5.1)
SODIUM: 141 mmol/L (ref 136–145)
TOTAL PROTEIN: 7.4 g/dL (ref 6.4–8.2)

## 2014-11-14 LAB — URINE DIP (POINT OF CARE)
BILIRUBIN, URINE: NEGATIVE
GLUCOSE, URINE: NEGATIVE mg/dl
KETONE, URINE: NEGATIVE mg/dl
NITRITE, URINE: NEGATIVE
PH URINE: 6.5 (ref 5.0–8.0)
PROTEIN, URINE: NEGATIVE mg/dl (ref 0–15)
SPECIFIC GRAVITY URINE: 1.02 (ref 1.003–1.030)
UROBILINOGEN URINE: 1 mg/dl (ref 0.2–1.0)

## 2014-11-14 NOTE — Progress Notes (Signed)
SUBJECTIVE:    Deborah Mathis is a 59 year old female presents today for  Two issues, BP, and viral/syndrome/cough    URI: had symptoms x 1 month, tried many OTC, seen here, symptoms gradually improved   NOw has cough again, dry.     Blood pressure: denies dizziness chest pain      Patient Active Problem List:     CARPAL TUNNEL SYNDROME     Nonspecific reaction to tuberculin skin test without active tuberculosis     Iron deficiency anemia     Uterine fibroid     Hydradenitis     Seasonal allergies      Medication reconciliation performed today.     Social History Narrative    Born: Born [place], came [place] age 86    Family: lives with husband and son (b. 1991)    Education: has GED    Social: 3 bros and 1 sister living in [place] (one brother dec.d/t CAD)    Occupation:assembly line work    Leisure:    2012: just completed 20 years at her job        2013:    Lost job x 4 months; now working at [employer].        11/14/2014                          Review of Systems      OBJECTIVE:  BP 153/78 mmHg   Pulse 82   Temp(Src) 97.3 F (36.3 C) (Temperature probe)   Wt    SpO2 99%  General: WDWn obese woman in no apparent distress   Fundi:  HEENT:  no  nasa; congestion; pharynx: benign; ears: TMs normal; no lymphadenopathy  Cardiovascular:  regular rate and rhythm, normal sounds and absence of murmurs, rubs or gallops  Lungs:  clear to auscultation without rales, ronchi, or wheezing  Abdomen:  obesesoft, unremarkable and without evidence of organomegally, masses, or abdominal aortic enlargement, no guarding       ASSESSMENT/PLAN:    (I10) Essential hypertension  (primary encounter diagnosis)  Comment: Over last 3 years she has hd several systolic BPs >938, meeting the definition of HTN> BMP is normal  Plan: COMP METABOLIC PANEL, FASTING, COLLECTION         VENOUS BLOOD VENIPUNCTURE, URINE DIP (POINT OF         CARE)        Wil ask RN to outreach to pt to start HCTZ 25 mg/day and do some BP teaching,  recheck BP 2  weeks after starting. If still high would add low dose lisinopril 5 mg daily; recheck in 2 more weeks and get BMP then       Viral syndrome - resolving,  follow up2 weeks RN visits; PCP in6-8 weeks      Current Outpatient Prescriptions:  omeprazole (PRILOSEC) 20 MG capsule TAKE 1 CAPSULE BY MOUTH DAILY AS NEEDED. Disp: 30 capsule Rfl: 11     No current facility-administered medications for this visit.    Electronically signed by: Sandria Manly, MD, 11/14/2014, 5:49 PM      This note is electronically signed in the electronic medical record.

## 2014-11-16 LAB — URINE CULTURE

## 2014-11-18 DIAGNOSIS — B349 Viral infection, unspecified: Secondary | ICD-10-CM | POA: Insufficient documentation

## 2014-11-18 MED ORDER — HYDROCHLOROTHIAZIDE 25 MG PO TABS
25.0000 mg | ORAL_TABLET | Freq: Every day | ORAL | Status: DC
Start: 2014-11-18 — End: 2015-02-28

## 2014-11-30 ENCOUNTER — Ambulatory Visit (HOSPITAL_BASED_OUTPATIENT_CLINIC_OR_DEPARTMENT_OTHER): Payer: BLUE CROSS/BLUE SHIELD

## 2014-12-05 ENCOUNTER — Ambulatory Visit (HOSPITAL_BASED_OUTPATIENT_CLINIC_OR_DEPARTMENT_OTHER): Payer: BLUE CROSS/BLUE SHIELD | Admitting: Ambulatory Care

## 2014-12-05 VITALS — BP 128/76 | HR 80 | Resp 16

## 2014-12-05 DIAGNOSIS — I10 Essential (primary) hypertension: Secondary | ICD-10-CM

## 2014-12-05 NOTE — Progress Notes (Signed)
Situation:BP check  Deborah Mathis, 59 year old, female is here today for a bp check  Background:    Was started on 25mg  of HCTZ on 11/18/14 as bp was elevated at her visit on 11/14/14  Tolerating the new medication well  Denies discomfort today  Discussed htn diet  Working on cutting back on sodium  Has cut back on coffee  Assessment:   Initial BP taken after brinnging her into exam room 130/90  Rechecked 15 minutes later:  BP 128/76 mmHg   Pulse 80   Resp 16   SpO2 98%  Most Recent BP Reading(s)  12/05/14 : 128/76  11/14/14 : 153/78  10/10/14 : 147/73  02/23/14 : 142/83  04/14/13 : 140/83  Recommendation:   Reassurance given that her bp is in great range.  Important to continue current medications, diet and exercise.  Pt expresses understanding and is in agreement with plan of care.

## 2015-02-27 ENCOUNTER — Ambulatory Visit (HOSPITAL_BASED_OUTPATIENT_CLINIC_OR_DEPARTMENT_OTHER): Payer: BLUE CROSS/BLUE SHIELD

## 2015-02-28 ENCOUNTER — Ambulatory Visit (HOSPITAL_BASED_OUTPATIENT_CLINIC_OR_DEPARTMENT_OTHER): Payer: BLUE CROSS/BLUE SHIELD

## 2015-02-28 ENCOUNTER — Encounter (HOSPITAL_BASED_OUTPATIENT_CLINIC_OR_DEPARTMENT_OTHER): Payer: Self-pay

## 2015-02-28 VITALS — BP 133/67 | HR 78 | Temp 97.3°F | Ht 60.0 in | Wt 182.0 lb

## 2015-02-28 DIAGNOSIS — M79641 Pain in right hand: Secondary | ICD-10-CM

## 2015-02-28 DIAGNOSIS — J302 Other seasonal allergic rhinitis: Secondary | ICD-10-CM

## 2015-02-28 DIAGNOSIS — R059 Cough, unspecified: Secondary | ICD-10-CM

## 2015-02-28 DIAGNOSIS — M180 Bilateral primary osteoarthritis of first carpometacarpal joints: Secondary | ICD-10-CM

## 2015-02-28 DIAGNOSIS — M79642 Pain in left hand: Secondary | ICD-10-CM

## 2015-02-28 DIAGNOSIS — D500 Iron deficiency anemia secondary to blood loss (chronic): Secondary | ICD-10-CM

## 2015-02-28 DIAGNOSIS — R05 Cough: Secondary | ICD-10-CM

## 2015-02-28 DIAGNOSIS — Z0000 Encounter for general adult medical examination without abnormal findings: Secondary | ICD-10-CM

## 2015-02-28 DIAGNOSIS — I10 Essential (primary) hypertension: Secondary | ICD-10-CM

## 2015-02-28 DIAGNOSIS — Z124 Encounter for screening for malignant neoplasm of cervix: Secondary | ICD-10-CM

## 2015-02-28 LAB — CBC WITH PLATELET
HEMATOCRIT: 39.6 % (ref 34.1–44.9)
HEMOGLOBIN: 13 g/dL (ref 11.2–15.7)
MEAN CORP HGB CONC: 32.8 g/dL (ref 31.0–37.0)
MEAN CORPUSCULAR HGB: 26.6 pg (ref 26.0–34.0)
MEAN CORPUSCULAR VOL: 81 fL (ref 80.0–100.0)
MEAN PLATELET VOLUME: 12.5 fL (ref 8.7–12.5)
PLATELET COUNT: 201 10*3/uL (ref 150–400)
RBC DISTRIBUTION WIDTH STD DEV: 40.9 fL (ref 35.1–46.3)
RBC DISTRIBUTION WIDTH: 13.9 % (ref 11.5–14.3)
RED BLOOD CELL COUNT: 4.89 M/uL (ref 3.90–5.20)
WHITE BLOOD CELL COUNT: 5.5 10*3/uL (ref 4.0–11.0)

## 2015-02-28 MED ORDER — ALBUTEROL SULFATE HFA 108 (90 BASE) MCG/ACT IN AERS
2.0000 | INHALATION_SPRAY | Freq: Four times a day (QID) | RESPIRATORY_TRACT | Status: DC | PRN
Start: 2015-02-28 — End: 2016-12-16

## 2015-02-28 MED ORDER — HYDROCHLOROTHIAZIDE 25 MG PO TABS
25.0000 mg | ORAL_TABLET | Freq: Every day | ORAL | Status: DC
Start: 2015-02-28 — End: 2015-06-22

## 2015-02-28 MED ORDER — ACETAMINOPHEN-CODEINE #3 300-30 MG PO TABS
1.0000 | ORAL_TABLET | Freq: Two times a day (BID) | ORAL | Status: DC | PRN
Start: 2015-02-28 — End: 2016-05-01

## 2015-02-28 MED ORDER — NAPROXEN 500 MG PO TABS
500.0000 mg | ORAL_TABLET | Freq: Two times a day (BID) | ORAL | Status: DC
Start: 2015-02-28 — End: 2016-05-01

## 2015-02-28 MED ORDER — FLUTICASONE PROPIONATE 50 MCG/ACT NA SUSP
2.00 | Freq: Every day | NASAL | Status: AC
Start: 2015-02-28 — End: 2015-08-27

## 2015-02-28 NOTE — Progress Notes (Signed)
FEMALE PHYSICAL      SUBJECTIVE:    Deborah Mathis is a 59 year old female who presents for a physical exam.      The patient's current concerns are:    CMC arthritis both thumbs, plus OA of fingers. Denies locking. Swelling. Takes ibuprofen 200 mg OTC/ naproxen 220 mg.prn    Weight: cont to increase. Walks daily during lunch hour.    Leg swelling at end of work day (sitting)    Obesity: pt concerned. 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    Social History    Marital Status: Married             Spouse Name:                       Years of Education:                 Number of children:               Social History Main Topics    Smoking Status: Never Smoker                      Alcohol Use: No                 Comment: 1 drin every 1-2 montrhs    Drug Use: No              Sexual Activity: Yes               Partners with: Female    Other Topics            Concern  Military Service        No  Occupational Exposure   No    Comment:repetitive motion hands  Sleep Concern           Yes  Stress Concern          No  Weight Concern          Yes  Special Diet            No    Comment:many vegetables  Exercise                No    Comment:walking 90 min, while unemployed  Therapist, art               Yes    Social History Narrative    Born: Born [place], came to [place] age 89    Family: lives with husband and son (b. 1991)    Education: has GED    Social: 3 bros and 1 sister living in [place] (one brother dec.d/t CAD)    Occupation:assembly line work    Leisure:    2012: just completed 20 years at her job        2013:    Lost job x 4 months; now working at [employer].        11/14/2014  Family History    Heart Brother      Comment: deceased    Heart Brother     Heart Father     Comment: deceased    Heart Mother     Diabetes FamHxNeg     Cancer - Prostate Brother        I have reviewed the past medical, surgical, social and family history and updated these sections of EpicCare as relevant. All interim labs, test results, and consult notes were reviewed and discussed with Deborah Mathis. Medications were reconciled during this visit and a current medication list was given to the patient at the end of the visit.    REVIEW OF SYSTEMS:  CONSTITUTIONAL:  Denies fatigue, weight change  EYES:  Denies visual changes or pain  ENT:  Denies sore throat, nasal discharge, sinus pain, difficulty hearing.  CARDIOVASCULAR:  Denies chest pain, palpitations or swelling  RESPIRATORY:  Denies cough or shortness of breath  GI:  Denies abdominal pain, nausea, vomiting; no change in bowel habits  MUSCULOSKELETAL:  Denies back pain  SKIN:  No rash or worrisome lesions   NEUROLOGIC:  Denies headache, focal weakness or sensory changes  ENDOCRINE:   Weight  increasing  GYN: postmenoopausal, no vaginal bleeding    LYMPHATIC:  Denies swollen glands  PSYCHIATRIC:  Denies depression, anxiety  All systems negative except as marked.    OBJECTIVE:  Vitals:BP 133/67 mmHg   Pulse 78   Temp(Src) 97.3 F (36.3 C) (Temporal)   Ht 5' (1.524 m)   Wt 82.555 kg (182 lb)   BMI 35.54 kg/m2   SpO2 99%  Constitutional:  WDWN obese woman, alert cooperative and in no apparent distress  HENT: Normocephalic, Atraumatic, Bilateral external ears normal, Oropharynx moist, No oral exudates, Nose normal.  Eyes:  PERRL,  Conjunctiva normal, No discharge.  Neck: Normal range of motion, No tenderness, Supple, No lymphadenopathy,   Thyroid: No thyromegaly, non-tender, no nodules.  Cardiovascular:  Regular rate and rhythm.  No murmurs, clicks, rubs.  No S3, S4.  Pulmonary/Chest:  Normal breath sounds, No respiratory distress, No wheezing, rales,or rhonchi. No chest tenderness  Breasts: Symmetric,  No masses or tenderness, No nipple discharge.   Abdomen:  Bowel sounds normal. Abd is very obese, soft.  No tenderness, no masses, no hepatosplenomegaly  Back:  No musculoskeletal tenderness, No CVA tenderness  Extremities:  Normal range of motion, Intact distal pulses, No edema, No tenderness  Pelvic:  Normal external genitalia.  Normal vagina with no polyps or lesions and with physiologic discharge.  Normal cervix with normal mucosa and without CMT.  Uterus: Nontender, not enlarged, no nodules. Adnexae negative.  Lymphatic:  No lymphadenopathy noted  Neurologic:  Alert & oriented ,  motor and sensory function grossly normal. No focal deficits, normal gait  Skin:  Warm, Dry, No erythema, No rash  Psychiatric:  Affect normal, Judgement normal, Mood normal    ASSESSMENT/PLAN:     (Z00.00) Routine general medical examination at a health care facility  (primary encounter diagnosis)  Comment: does walk regularly (daily at work [employer] breaks/lunch  Plan: HEPATITIS C ANTIBODY            (I10) Essential hypertension  Comment: OK contro today  Plan: hydrochlorothiazide (HYDRODIURIL) 25 MG tablet,        BASIC METABOLIC PANEL        refill    (E66.01) Morbid obesity due to excess calories  Comment: majpr health risk; discussed referral to nutrition, [employer];  recommended going to intro class on bariatric surger at [place]: [place] ( INT) NON-DIABETES            (M79.641,  M79.642) Pain in both hands  Comment: 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    (D50.0) Iron deficiency anemia due to chronic blood loss  Comment: likely resolve as PMP  Plan: CBC WITH PLATELET, COLLECTION VENOUS BLOOD         VENIPUNCTURE            (J30.2) Seasonal allergies  Comment:   Plan: fluticasone (FLONASE) 50 MCG/ACT nasal spray        Use 1-2 wks as needed for allergies    (M18.0) Primary osteoarthritis of both first carpometacarpal  joints  Comment: reviewed patholgy, conservative measure (ice, rest, prn   Plan: as above    (Z12.4) Screening for cervical cancer  Comment: overdue  Plan: CYTP CERV/VAG AUTO THIN LAYER PREP MNL SCREEN,         HUMAN PAPILLOMAVIRUS            (R05) Cough  Comment: likely RAD equivalent, given allergic rhinitis  Plan: acetaminophen-codeine (TYLENOL #3) 300-30 MG         per tablet, albuterol (PROAIR HFA) 108 (90         BASE) MCG/ACT inhaler                Follow-up:  With new PCP

## 2015-03-01 LAB — HUMAN PAPILLOMAVIRUS (HPV): HUMAN PAPILLOMAVIRUS: NEGATIVE

## 2015-03-02 LAB — BASIC METABOLIC PANEL
ANION GAP: 8 mmol/L (ref 5–15)
BUN (UREA NITROGEN): 14 mg/dL (ref 7–18)
CALCIUM: 8.7 mg/dL (ref 8.5–10.1)
CARBON DIOXIDE: 30 mmol/L (ref 21–32)
CHLORIDE: 104 mmol/L (ref 98–107)
CREATININE: 0.6 mg/dL (ref 0.4–1.2)
ESTIMATED GLOMERULAR FILT RATE: >60 mL/min (ref >60)
Glucose Random: 92 mg/dL (ref 74–160)
POTASSIUM: 3.7 mmol/L (ref 3.5–5.1)
SODIUM: 142 mmol/L (ref 136–145)

## 2015-03-02 LAB — HEPATITIS C ANTIBODY: HEPATITIS C ANTIBODY: NONREACTIVE

## 2015-03-03 LAB — CYTOPATH, C/V, THIN LAYER

## 2015-03-21 ENCOUNTER — Encounter (HOSPITAL_BASED_OUTPATIENT_CLINIC_OR_DEPARTMENT_OTHER): Payer: Self-pay

## 2015-03-31 ENCOUNTER — Ambulatory Visit (HOSPITAL_BASED_OUTPATIENT_CLINIC_OR_DEPARTMENT_OTHER): Payer: BLUE CROSS/BLUE SHIELD | Admitting: Registered"

## 2015-04-10 ENCOUNTER — Ambulatory Visit (HOSPITAL_BASED_OUTPATIENT_CLINIC_OR_DEPARTMENT_OTHER): Payer: BLUE CROSS/BLUE SHIELD

## 2015-05-11 ENCOUNTER — Ambulatory Visit (HOSPITAL_BASED_OUTPATIENT_CLINIC_OR_DEPARTMENT_OTHER): Payer: Self-pay

## 2015-05-12 LAB — MA SCREENING MAMMO BILATERAL WITH CAD

## 2015-05-13 ENCOUNTER — Other Ambulatory Visit (HOSPITAL_BASED_OUTPATIENT_CLINIC_OR_DEPARTMENT_OTHER): Payer: Self-pay

## 2015-06-22 ENCOUNTER — Other Ambulatory Visit (HOSPITAL_BASED_OUTPATIENT_CLINIC_OR_DEPARTMENT_OTHER): Payer: Self-pay

## 2015-06-22 DIAGNOSIS — I10 Essential (primary) hypertension: Secondary | ICD-10-CM

## 2015-06-22 NOTE — Progress Notes (Signed)
PER Pharmacy, Deborah Mathis is a 59 year old female has requested a refill of Hydrochlorothiazide 25mg .      Last Office Visit: 02/28/2015  Last Physical Exam: 02/28/2015      HTN Med:    Most Recent BP Reading(s)  02/28/15 : 133/67  12/05/14 : 128/76  11/14/14 : 153/78      Documented patient preferred pharmacies:    [id number] [place],  - [place]  Phone: [phone] Fax: [phone]

## 2015-12-12 ENCOUNTER — Other Ambulatory Visit (HOSPITAL_BASED_OUTPATIENT_CLINIC_OR_DEPARTMENT_OTHER): Payer: Self-pay

## 2015-12-12 NOTE — Progress Notes (Signed)
PER Pharmacy, Deborah Mathis is a 60 year old female has requested a refill of Omeprazole 20mg .      Last Office Visit: 02/28/2015     With: Sandria Manly  Last Physical Exam: 02/28/2015      Other Med Adult:  Most Recent BP Reading(s)  02/28/15 : 133/67          CHOLESTEROL (mg/dL)   Date Value   02/23/2014 185   ----------    LOW DENSITY LIPOPROTEIN DIRECT (mg/dL)   Date Value   02/23/2014 138   ----------    HIGH DENSITY LIPOPROTEIN (mg/dL)   Date Value   02/23/2014 30 (L)   ----------    TRIGLYCERIDES (mg/dl)   Date Value   01/22/2010 170 (H)   ----------      No results found for: TSHSC      No results found for: TSH    No results found for: HGBA1C      No results found for: INR       Documented patient preferred pharmacies:    [id number] [place] [place]  Phone: [phone] Fax: [phone]

## 2016-04-29 ENCOUNTER — Telehealth (HOSPITAL_BASED_OUTPATIENT_CLINIC_OR_DEPARTMENT_OTHER): Payer: Self-pay | Admitting: Primary Care

## 2016-04-29 NOTE — Telephone Encounter (Signed)
-----   Message from Morton Peters sent at 04/29/2016  1:10 PM EDT -----  Regarding: alot of pain  Contact: [phone]  [place] [id number], 60 year old, female, Telephone Information:  Home Phone      [phone]  Work Phone      [phone]  Mobile          [phone]      Patient's Preferred Pharmacy:     [id number] [place], [place]  Phone: [phone] Fax: [phone]      CONFIRMED TODAY: Yes      Patient's language of care: English    Patient does not need an interpreter.    Patient's PCP: Carolin Sicks MD    Person calling on behalf of patient: Patient (self)    Calls today with a sick call. Pt stated that she has an appt with [facility] Wednesday, but in a lot of pain needs to see her before that, pt is  Asking if PA can see her today.  Please call pt [phone]

## 2016-04-29 NOTE — Progress Notes (Signed)
Message left for patient to return my call.  

## 2016-05-01 ENCOUNTER — Ambulatory Visit (HOSPITAL_BASED_OUTPATIENT_CLINIC_OR_DEPARTMENT_OTHER): Payer: BLUE CROSS/BLUE SHIELD | Admitting: Internal Medicine

## 2016-05-01 ENCOUNTER — Encounter (HOSPITAL_BASED_OUTPATIENT_CLINIC_OR_DEPARTMENT_OTHER): Payer: Self-pay | Admitting: Internal Medicine

## 2016-05-01 ENCOUNTER — Ambulatory Visit (HOSPITAL_BASED_OUTPATIENT_CLINIC_OR_DEPARTMENT_OTHER): Payer: Self-pay | Admitting: Internal Medicine

## 2016-05-01 ENCOUNTER — Telehealth (HOSPITAL_BASED_OUTPATIENT_CLINIC_OR_DEPARTMENT_OTHER): Payer: Self-pay | Admitting: Ambulatory Care

## 2016-05-01 VITALS — BP 113/57 | HR 65 | Temp 97.9°F | Wt 190.0 lb

## 2016-05-01 DIAGNOSIS — M1711 Unilateral primary osteoarthritis, right knee: Secondary | ICD-10-CM

## 2016-05-01 DIAGNOSIS — M25561 Pain in right knee: Secondary | ICD-10-CM

## 2016-05-01 DIAGNOSIS — R05 Cough: Secondary | ICD-10-CM

## 2016-05-01 DIAGNOSIS — R059 Cough, unspecified: Secondary | ICD-10-CM

## 2016-05-01 LAB — XR KNEE RIGHT 3 VIEWS

## 2016-05-01 MED ORDER — NAPROXEN 500 MG PO TABS
500.0000 mg | ORAL_TABLET | Freq: Two times a day (BID) | ORAL | 1 refills | Status: DC
Start: 2016-05-01 — End: 2016-12-25

## 2016-05-01 MED ORDER — ACETAMINOPHEN-CODEINE #3 300-30 MG PO TABS
1.0000 | ORAL_TABLET | Freq: Four times a day (QID) | ORAL | 0 refills | Status: AC | PRN
Start: 2016-05-01 — End: 2016-05-09

## 2016-05-01 NOTE — Addendum Note (Signed)
Addended by: Jilda Roche on: 05/01/2016 09:45 AM     Modules accepted: Orders

## 2016-05-01 NOTE — Progress Notes (Signed)
Informed pt of results per Elvira  Pt informed of upcomming appt 05/22/2016  Pt agrees and understands

## 2016-05-01 NOTE — Progress Notes (Signed)
Deborah Mathis is a 60 year old female  Pain of the right knee started 2 weeks ago   Then on Saturday 6/24 developed swelling of the right thigh and the knee   Naproxen  500 mg BID - helps with the pain.   Resting and sitting brings pain about 3/10, walking , going down stairs causing more pain   No hx of trauma or over exercising   Pt is also coughing ,having coughing fits. Request a refill of codeine that helping for cough and pain   Last rx given on 4/26 for # 30 pills    Denies SOB.  OBJECTIVE:  BP 113/57   Pulse 65   Temp 97.9 F (36.6 C) (Temporal)   Wt 86.2 kg (190 lb)   SpO2 97%   BMI 37.11 kg/m2  Appearance: NAD  Chest: good air entry b/l, no wheezing/rhonchi/crackles, no retractions/flaring  Heart: RRR, no M/R/G  Musculoskeletal: right knee positive for tenderness on LCL and MCL on flexion .  FORM . No erythema/swelling   Assessment:  (M25.561) Acute pain of right knee  (primary encounter diagnosis)  Comment:  causing pt's discomfort  Fu xray . Continue NSAIDs  Plan: XR KNEE RIGHT 3 VW, naproxen (NAPROSYN) 500 MG         tablet        Exercises discussed and handout given    (R05) Cough  Comment: codeine helps for night time cough   Refill   Plan: acetaminophen-codeine (TYLENOL #3) 300-30 MG         per tablet          Fu for knee pain in 3 weeks when pt return back from vacation. Pt also interested in labs. PE to schedule discussed.

## 2016-05-01 NOTE — Telephone Encounter (Signed)
-----   Message from Gasper Sells sent at 05/01/2016  4:15 PM EDT -----  Regarding: fu xray  Hi,  Please let pt know xray + for mild changes related to age  Recommended : continue Naproxen, ice/hit and exercises that given   Fu as scheduled   Thank you,  Ricci Barker

## 2016-05-01 NOTE — Patient Instructions (Signed)
Index

## 2016-05-08 NOTE — Progress Notes (Signed)
Patient seen in the office on 05/01/16

## 2016-05-15 ENCOUNTER — Ambulatory Visit (HOSPITAL_BASED_OUTPATIENT_CLINIC_OR_DEPARTMENT_OTHER): Payer: Self-pay | Admitting: Internal Medicine

## 2016-05-22 ENCOUNTER — Ambulatory Visit (HOSPITAL_BASED_OUTPATIENT_CLINIC_OR_DEPARTMENT_OTHER): Payer: BLUE CROSS/BLUE SHIELD | Admitting: Internal Medicine

## 2016-05-22 VITALS — BP 127/61 | HR 61 | Temp 97.6°F | Wt 187.0 lb

## 2016-05-22 DIAGNOSIS — M25561 Pain in right knee: Secondary | ICD-10-CM

## 2016-05-22 DIAGNOSIS — Z23 Encounter for immunization: Secondary | ICD-10-CM

## 2016-05-22 DIAGNOSIS — Z1329 Encounter for screening for other suspected endocrine disorder: Secondary | ICD-10-CM

## 2016-05-22 DIAGNOSIS — M7989 Other specified soft tissue disorders: Secondary | ICD-10-CM

## 2016-05-22 DIAGNOSIS — Z13228 Encounter for screening for other metabolic disorders: Secondary | ICD-10-CM

## 2016-05-22 DIAGNOSIS — Z130 Encounter for screening for diseases of the blood and blood-forming organs and certain disorders involving the immune mechanism: Secondary | ICD-10-CM

## 2016-05-22 LAB — COMPREHENSIVE METABOLIC PANEL
ALANINE AMINOTRANSFERASE: 38 U/L (ref 12–45)
ALBUMIN: 3.9 g/dL (ref 3.4–5.0)
ALKALINE PHOSPHATASE: 76 U/L (ref 45–117)
ANION GAP: 6 mmol/L (ref 5–15)
ASPARTATE AMINOTRANSFERASE: 21 U/L (ref 8–34)
BILIRUBIN TOTAL: 0.5 mg/dL (ref 0.2–1.0)
BUN (UREA NITROGEN): 21 mg/dL — ABNORMAL HIGH (ref 7–18)
CALCIUM: 9.3 mg/dL (ref 8.5–10.1)
CARBON DIOXIDE: 29 mmol/L (ref 21–32)
CHLORIDE: 109 mmol/L — ABNORMAL HIGH (ref 98–107)
CREATININE: 0.7 mg/dL (ref 0.4–1.2)
ESTIMATED GLOMERULAR FILT RATE: >60 mL/min (ref >60)
Glucose Random: 98 mg/dL (ref 74–160)
POTASSIUM: 4.3 mmol/L (ref 3.5–5.1)
SODIUM: 144 mmol/L (ref 136–145)
TOTAL PROTEIN: 7 g/dL (ref 6.4–8.2)

## 2016-05-22 LAB — LIPID PANEL
Cholesterol: 201 mg/dL (ref 0–239)
HIGH DENSITY LIPOPROTEIN: 40 mg/dL (ref 40–?)
LOW DENSITY LIPOPROTEIN DIRECT: 139 mg/dL (ref 0–189)
TRIGLYCERIDES: 184 mg/dL — ABNORMAL HIGH (ref 0–150)

## 2016-05-22 LAB — VITAMIN D,25 HYDROXY: VITAMIN D,25 HYDROXY: 14 ng/mL — CL (ref 30.0–100.0)

## 2016-05-22 MED ORDER — COMPRESSION STOCKINGS
1.00 | Freq: Every day | 11 refills | Status: AC
Start: 2016-05-22 — End: 2017-05-22

## 2016-05-22 NOTE — Progress Notes (Signed)
Deborah Mathis is a 60 year old female  Fu right knee pain   Seen for this problem on 6/28  It is slowly improving since  Doing  home exercising   Still taking Naproxen for arthritic pain started 2 times  /day for a week because of the knee pain and then switched back to 1 pill /day  Also complains on feet swelling at the end of day , most of the days especially recently   Doesn't wear compression stockings, never been recommended. Keeps feet up and it helps  Requested labs done to keep eye on lipids,sugar   Scheduled for PE on 8/7 and want to discuss labs results at that time    Due for tetanus vaccination. Due for  Zoster vaccination. Recommended to stop by [facility] to get it  OBJECTIVE:  BP 127/61   Pulse 61   Temp 97.6 F (36.4 C) (Temporal)   Wt 84.8 kg (187 lb)   SpO2 99%   BMI 36.52 kg/m2  Appearance: NAD  Skin: no rash  Neck: supple, no JVD  Chest: good air entry b/l, no wheezing/rhonchi/crackles, no retractions/flaring  Heart: RRR, no M/R/G  Musculoskeletal: light puffiness of the feet B/L. Dorsalis pedis, posterior tibial arteries +3. feet: normal DP and PT pulses, no trophic changes or ulcerative lesions and normal sensory exam    Assessment:  (M25.561) Acute pain of right knee  (primary encounter diagnosis)  Comment: improving   Knee xray results reviewed   Plan: continue home exercises. Monitoring     (Z13.29,  Z13.228,  Z13.0) Screening for endocrine/metabolic/immunity disorders  Comment: fu labs   Plan: LIPID PANEL, HEMOGLOBIN A1C, COMPREHENSIVE         METABOLIC PANEL, VITAMIN [id number] HYDROXY            (M79.89) Swollen feet  Comment: possibly due to venous insufficiency   Plan: Elastic Bandages & Supports (COMPRESSION         STOCKINGS)        Feet elevation     (Z23) Need for prophylactic vaccination with tetanus-diphtheria (TD)  Comment: HM  Plan: IMMUNIZATION ADMIN SINGLE, RN, TD TOXOIDS         ABSORBED 7 AND OLDER IM (PUBLIC)          Return back if no improvement or worsening  of the symptoms  Keep an apt for PE on 06/10/16

## 2016-05-22 NOTE — Progress Notes (Signed)
05/22/2016  VIS given prior to administration and reviewed with the patient and or legal guardian. Patient understands the disease and the vaccine. See immunization/Injection module or chart review for date of publication and additional information.  Randel Pigg, LPN

## 2016-05-23 LAB — HEMOGLOBIN A1C
ESTIMATED AVERAGE GLUCOSE: 105 (ref 74–160)
HEMOGLOBIN A1C: 5.3 % (ref 4.0–5.6)

## 2016-05-28 DIAGNOSIS — Z1231 Encounter for screening mammogram for malignant neoplasm of breast: Principal | ICD-10-CM

## 2016-05-29 LAB — MA SCREENING MAMMO BILATERAL WITH CAD

## 2016-06-10 ENCOUNTER — Ambulatory Visit (HOSPITAL_BASED_OUTPATIENT_CLINIC_OR_DEPARTMENT_OTHER): Payer: BLUE CROSS/BLUE SHIELD | Admitting: Internal Medicine

## 2016-06-10 ENCOUNTER — Encounter (HOSPITAL_BASED_OUTPATIENT_CLINIC_OR_DEPARTMENT_OTHER): Payer: Self-pay | Admitting: Internal Medicine

## 2016-06-10 VITALS — BP 134/62 | HR 74 | Temp 96.0°F | Ht 60.24 in | Wt 190.0 lb

## 2016-06-10 DIAGNOSIS — E559 Vitamin D deficiency, unspecified: Secondary | ICD-10-CM

## 2016-06-10 DIAGNOSIS — Z0000 Encounter for general adult medical examination without abnormal findings: Secondary | ICD-10-CM

## 2016-06-10 DIAGNOSIS — E782 Mixed hyperlipidemia: Secondary | ICD-10-CM

## 2016-06-10 MED ORDER — ERGOCALCIFEROL 1.25 MG (50000 UT) PO CAPS
50000.0000 [IU] | ORAL_CAPSULE | ORAL | 0 refills | Status: DC
Start: 2016-06-10 — End: 2016-12-25

## 2016-06-10 NOTE — Progress Notes (Signed)
Chief Complaint:  Deborah Mathis is a 60 year old female who presents for a physical exam.  Patient denies any current related concerns.  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   Married, son is 72 y/o , still lives with parents   No pets  No diet   No exercising     Patient Active Problem List:     Carpal tunnel syndrome     Nonspecific reaction to tuberculin skin test without active tuberculosis     Iron deficiency anemia     Uterine fibroid     Hydradenitis     Seasonal allergies     Viral syndrome        Current Outpatient Prescriptions:  Elastic Bandages & Supports (COMPRESSION STOCKINGS) 1 each by Other route daily Disp: 2 each Rfl: 11   naproxen (NAPROSYN) 500 MG tablet Take 1 tablet by mouth 2 (two) times daily with meals As needed for arthritis pain Disp: 60 tablet Rfl: 1   omeprazole (PRILOSEC) 20 MG capsule Take 1 capsule by mouth daily as needed Disp: 30 capsule Rfl: 11   hydrochlorothiazide (HYDRODIURIL) 25 MG tablet TAKE 1 TABLET BY MOUTH EVERY DAY Disp: 30 tablet Rfl: 11   albuterol (PROAIR HFA) 108 (90 BASE) MCG/ACT inhaler Inhale 2 puffs into the lungs 4 (four) times daily as needed for Wheezing (cough). Use with upper resp infection Disp: 1 Inhaler Rfl: 2     No current facility-administered medications for this visit.     Allergies:  Review of Patient's Allergies indicates:  No Known Allergies    Health Maintenance:  ZOSTER VACCINE,AGE 28 AND OLDER (ONCE) due on 11/29/2015  PHYSICAL EXAM (AGE 6+) due on 02/28/2016  TDAP/TD VACCINE(1 - Tdap) due on 05/23/2016  INFLUENZA VACCINE(1) due on 07/05/2016  PHQ-9 due on 05/01/2017  AWQ Questionnaire due on 05/01/2017  MAMMOGRAPHY due on 05/28/2018  PAP SMEAR due on 02/28/2020  HPV SCREENING due on 02/28/2020  COLONOSCOPY due on 10/12/2020  HEALTH CARE PROXY due on 05/01/2021  LIPID SCREENING due on 05/22/2021  HIV SCREENING Completed  HEP B  HIGH RISK VACCINE EVAL (ONCE) Completed  HEP C SCREEN (DOB [birth year]) Completed    Immunizations:  Immunization History   Administered Date(s) Administered    Td 05/22/2016   Deferred Date(s) Deferred    Tdap 01/31/2010       Histories:  No past medical history on file.  No past surgical history on file.    Social History   Marital status: Married  Spouse name: N/A    Years of education: N/A  Number of children: N/A     Occupational History  None on file     Social History Main Topics   Smoking status: Never Smoker    Smokeless tobacco: Not on file    Alcohol use No    Comment: 1 drin every 1-2 montrhs    Drug use: No    Sexual activity: Yes    Partners: Male     Other Topics Concern    Military Service No    Occupational Exposure No    Comment: repetitive motion hands    Sleep Concern Yes    Stress Concern No    Weight Concern Yes    Special Diet No    Comment: many vegetables    Exercise No  Comment: walking 90 min, while unemployed    Therapist, art Yes     Social History Narrative    Born: Born [place], came to [place] age 23    Family: lives with husband and son (b. 1991)    Education: has GED    Social: 3 bros and 1 sister living in [place] (one brother dec.d/t CAD)    Occupation:assembly line work    Leisure:    2012: just completed 20 years at her job        2013:    Lost job x 4 months; now working at [employer].        11/14/2014    NOw working [employer].    Soc: living with husb and son (14) - both working and in college                           Family History    Heart Brother     Comment: deceased    Heart Brother     Heart Father     Comment: deceased    Heart Mother     Diabetes FamHxNeg     Cancer - Prostate Brother        Review of Systems:                   Skin: negative, pigmentation, rash and itching  Eyes: negative, double vision, eye pain and itching  Ears/Nose/Throat: negative, tinnitus, tonsillitis and ear pain  Respiratory: negative  Cardiovascular: negative, tachycardia, chest pain and  orthopnea  Gastrointestinal: negative, nausea and abdominal pain  Genitourinary: negative, frequency, hematuria and incontinence  Gyn:no menses, no abnormal bleeding, pelvic pain or discharge  no breast pain or new or enlarging lumps on self exam  Musculoskeletal: negative, arthritis and muscular weakness  Neurologic: negative, syncope, seizures, vertigo and incoordination  Endocrine: negative, cold intolerance and heat intolerance  Psychiatric: negative, depression, hallucinatins and delusions  Hematologic/Lymphatic/Immunologic: negative, bleeding disorder, night sweats, chills and swollen nodes    Physical:  BP 134/62   Pulse 74   Temp 96 F (35.6 C) (Temporal)   Ht 5' 0.24" (1.53 m)   Wt 86.2 kg (190 lb)   SpO2 100%   BMI 36.82 kg/m2  General appearance: healthy, alert, well developed, well nourished  Eyes: conjunctivae/corneas clear. PERRL, EOM's intact. Fundi benign  Skin: skin color, texture, turgor are normal  Head: Normocephalic. No masses, lesions, tenderness or abnormalities  Ears: External ears normal. Canals clear. TM's normal.  Nose/Sinuses: Nares normal. Septum midline. Mucosa normal. No drainage or sinus tenderness.  Oropharynx: Lips, mucosa, and tongue normal. Teeth and gums normal. Oropharynx moist and without lesion  Neck: Neck supple. No adenopathy. Thyroid symmetric, normal size, and without nodularity  Back: Back symmetric, no curvature. ROM normal. No CVA tenderness.  Lungs: Percussion normal. Good diaphragmatic excursion. Lungs clear to auscultation bilaterally  Heart: PMI normal. No lifts, heaves, or thrills. RRR. No murmurs, clicks, gallops or rubs  Breast: breasts symmetric, no dominant or suspicious mass, no skin or nipple changes, no axillary adenopathy and self exam in taught and encouraged  Abdomen: Abdomen soft, non-tender. BS normal. No masses, no organomegaly  Extremities: Extremities normal. No deformities, edema, or skin discoloration  Musculoskeletal: Spine ROM normal. Muscular  strength intact.  Peripheral pulses: radial=4/4, femoral=4/4, popliteal=4/4, dorsalis pedis=4/4  Neuro: Gait normal. Reflexes normal and symmetric. Sensation grossly normal   Pelvic: last PAP in 2016, next in  20121  Rectal: deferred     Health Counseling:  Smoking:  Reviewed and Discussed  Substance Use Issues:   Reviewed and Discussed   Seat Belts:  Reviewed and Discussed  Protective Sports Equipment:  Reviewed and Discussed  Smoke Dectectors:  Reviewed and Discussed  Diet, Exercise, Wt. Control:  Reviewed and Discussed  Dental Health:  Reviewed and Discussed  Vision Health:  Reviewed and Discussed  Mental Health:  Reviewed and Discussed  Domestic Violence:  Reviewed and Discussed  Sexual Health:  Reviewed and Discussed  BSE:  Reviewed and Discussed  Osteoperosis: Reviewed and Discussed  Health Care Proxy:  Reviewed and Discussed    ASSESSMENT/PLAN:  (Z00.00) Routine general medical examination at a health care facility  (primary encounter diagnosis)  Comment: in good health   Plan: up to date with colonoscopy, mammogram    (E55.9) Vitamin D deficiency  Comment: fu labs in 3 months or latter  3 months of vit D supplement   Plan: ergocalciferol (VITAMIN D2) 50000 UNIT capsule,        VITAMIN D,25 HYDROXY, COLLECTION VENOUS BLOOD         VENIPUNCTURE            (E78.2) Elevated cholesterol with elevated triglycerides  Comment: change diet as decrease carbs intake  increase physical activity.  Repeat labs in 3 month, fasting   Plan: TRIGLYCERIDES, COLLECTION VENOUS BLOOD         VENIPUNCTURE          Counseling on safety issues such as  sun protection, safe driving,safe sex, osteoporosis prophylactics, healthy eating habits, abstinences from alcohol and smoking discussed

## 2016-06-10 NOTE — Patient Instructions (Signed)
[facility]  [facility]  [address]  [place] [address]  Clinical Nutrition Services    Calcium and Your Health    Calcium is a mineral that helps build strong bones and teeth, helps prevent brittle bones (osteoporosis) and hip fractures, and helps maintain a normal blood pressure. It is very important to get enough calcium each day.    * CALCIUM NEEDS AT ALL AGES: (RDI 2000)  Infants birth to 12 months  is 210 -270 mg/day          Child 1-8 years  is 500-.800 mg/day                     Adolescent 9-18years is 1300 mg/day                   Adults 19-30 years is 1000 mg/day  Adults 31-50 years is 1000 mg/day  Adults 51 and older is 1200 mg/day    * TO MAINTAIN GOOD BONE HEALTH, EAT A CALCIUM RICH DIET:    FOODS                                      Yogurt, plain, nonfat: 1 cup serving is 450 mgs of calcium    Ricotta cheese, part skim: 1/2 cup serving is 340 mgs of calcium    Milk, skim: 1 cup serving is 300 mgs of calcium    Milk, whole: 1 cup serving is 290 mgs of calcium    Sardines, canned, with bones: 3 oz serving is 280 mgs of calcium    Yogurt, plain, whole milk:1 cup serving is 275 mgs of calcium    Orange juice, calcium fortified:1 cup serving is 270 mgs of calcium    Swiss cheese: 1 oz serving is 270 mgs of calcium    Spinach, cooked: 1 cup serving is 240 mgs of calcium   Turnip greens, rhubarb, cooked:1 cup serving is 200 mgs of calcium   Salmon, canned, with bones: 3 oz serving is 180 mgs of calcium   Ice Cream: 1 cup serving is 175 mgs of calcium   Pudding, instant mix: 1/2 cup serving is 150 mgs of calcium   Almonds:  /2 cup serving is 150 mgs of calcium   Kale, cooked: 1 cup serving is 100 mgs of calcium   Lobster, cooked: 6 oz serving is 100 mgs of calcium   Tofu, with calcium sulfate:  3oz serving is 100 mgs of calcium     EXERCISE: Weight bearing exercises, such as walking, jogging, racquet sports, aerobics, and weight training  help make bones stronger.     VITAMIN  D: Vitamin D is essential to help your body absorb calcium. A healthy body can make its own vitamin D with the help of sunlight. But in the winter months, you may not get enough sun, and should make   sure you get it from another source, like milk or a multivitamin.     HORMONES: The hormone estrogen helps the female body and bones to use calcium. After menopause women need to discuss hormone treatment with their doctor.    * This is just a first step in helping improve your health.  * For help with a meal plan for you, make an appointment with the Nutritionist.  * For more information, you can also contact the National Nutrition Hotline 1-800-366-1655                                                                9/95 MAS 1/96, 12/97eqj, 01/02, 4/02

## 2016-06-11 ENCOUNTER — Ambulatory Visit (HOSPITAL_BASED_OUTPATIENT_CLINIC_OR_DEPARTMENT_OTHER): Payer: BLUE CROSS/BLUE SHIELD | Admitting: Medical

## 2016-06-13 ENCOUNTER — Encounter (HOSPITAL_BASED_OUTPATIENT_CLINIC_OR_DEPARTMENT_OTHER): Payer: Self-pay | Admitting: Internal Medicine

## 2016-07-06 ENCOUNTER — Other Ambulatory Visit (HOSPITAL_BASED_OUTPATIENT_CLINIC_OR_DEPARTMENT_OTHER): Payer: Self-pay | Admitting: Internal Medicine

## 2016-07-06 DIAGNOSIS — I10 Essential (primary) hypertension: Secondary | ICD-10-CM

## 2016-07-06 NOTE — Progress Notes (Signed)
PER Pharmacy, Deborah Mathis is a 60 year old female has requested a refill of HCTZ 25mg       Last Office Visit: 06/10/16   Last Physical Exam: 06/10/16       HTN Med:    Most Recent BP Reading(s)  06/10/16 : 134/62  05/22/16 : 127/61  05/01/16 : 113/57            Documented patient preferred pharmacies:    [id number] [place] [place] [address] [place]  Phone: [phone] Fax: [phone]

## 2016-08-31 ENCOUNTER — Other Ambulatory Visit (HOSPITAL_BASED_OUTPATIENT_CLINIC_OR_DEPARTMENT_OTHER): Payer: Self-pay | Admitting: Internal Medicine

## 2016-08-31 DIAGNOSIS — E559 Vitamin D deficiency, unspecified: Secondary | ICD-10-CM

## 2016-08-31 NOTE — Progress Notes (Signed)
PER Pharmacy, Deborah Mathis is a 60 year old female has requested a refill of Vit D 50,000units.      Last Office Visit: 06/10/16     With: Gasper Sells  Last Physical Exam: 06/10/16      Other Med Adult:  Most Recent BP Reading(s)  06/10/16 : 134/62          Cholesterol (mg/dL)   Date Value   05/22/2016 201   ----------    LOW DENSITY LIPOPROTEIN DIRECT (mg/dL)   Date Value   05/22/2016 139   ----------    HIGH DENSITY LIPOPROTEIN (mg/dL)   Date Value   05/22/2016 40   ----------    TRIGLYCERIDES (mg/dL)   Date Value   05/22/2016 184 (H)   ----------      No results found for: TSHSC      No results found for: TSH      HEMOGLOBIN A1C (%)   Date Value   05/22/2016 5.3   ----------      No results found for: INR      SODIUM (mmol/L)   Date Value   05/22/2016 144   ----------      POTASSIUM (mmol/L)   Date Value   05/22/2016 4.3   ----------          CREATININE (mg/dL)   Date Value   05/22/2016 0.7   ----------      Documented patient preferred pharmacies:    [id number] [place], [place] [address]  Phone: [phone] Fax: [phone]

## 2016-11-12 ENCOUNTER — Telehealth (HOSPITAL_BASED_OUTPATIENT_CLINIC_OR_DEPARTMENT_OTHER): Payer: Self-pay | Admitting: Internal Medicine

## 2016-11-12 NOTE — Progress Notes (Signed)
I called patient and I left a message on her voicemail about to schedule a fasting lab test.

## 2016-11-12 NOTE — Telephone Encounter (Signed)
-----   Message from [place] sent at 11/12/2016  8:17 AM EST -----  Hi,  Please schedule pt for fasting labs as fu from July visit.   Either just labs or visit + labs but she should be fasting   Thank you,  Ricci Barker

## 2016-12-16 ENCOUNTER — Ambulatory Visit (HOSPITAL_BASED_OUTPATIENT_CLINIC_OR_DEPARTMENT_OTHER): Payer: BLUE CROSS/BLUE SHIELD | Admitting: Medical

## 2016-12-16 ENCOUNTER — Encounter (HOSPITAL_BASED_OUTPATIENT_CLINIC_OR_DEPARTMENT_OTHER): Payer: Self-pay | Admitting: Medical

## 2016-12-16 VITALS — BP 138/65 | HR 77 | Temp 97.8°F | Wt 190.0 lb

## 2016-12-16 DIAGNOSIS — E782 Mixed hyperlipidemia: Secondary | ICD-10-CM

## 2016-12-16 DIAGNOSIS — E559 Vitamin D deficiency, unspecified: Secondary | ICD-10-CM

## 2016-12-16 DIAGNOSIS — J209 Acute bronchitis, unspecified: Secondary | ICD-10-CM | POA: Diagnosis not present

## 2016-12-16 LAB — VITAMIN D,25 HYDROXY: VITAMIN D,25 HYDROXY: 21 ng/mL — ABNORMAL LOW (ref 30.0–100.0)

## 2016-12-16 LAB — TRIGLYCERIDES: TRIGLYCERIDES: 239 mg/dL — ABNORMAL HIGH (ref 0–150)

## 2016-12-16 MED ORDER — BENZONATATE 100 MG PO CAPS
100.0000 mg | ORAL_CAPSULE | Freq: Three times a day (TID) | ORAL | 0 refills | Status: DC | PRN
Start: 2016-12-16 — End: 2017-12-18

## 2016-12-16 MED ORDER — BENZONATATE 100 MG PO CAPS: 100 mg | capsule | Freq: Three times a day (TID) | ORAL | 0 refills | 0 days | Status: DC | PRN

## 2016-12-16 MED ORDER — ALBUTEROL SULFATE HFA 108 (90 BASE) MCG/ACT IN AERS
2.0000 | INHALATION_SPRAY | Freq: Four times a day (QID) | RESPIRATORY_TRACT | 2 refills | Status: DC | PRN
Start: 2016-12-16 — End: 2018-04-10

## 2016-12-16 MED ORDER — ALBUTEROL SULFATE HFA 108 (90 BASE) MCG/ACT IN AERS: 2 | Inhaler | Freq: Four times a day (QID) | RESPIRATORY_TRACT | 2 refills | 0 days | Status: DC | PRN

## 2016-12-16 NOTE — Progress Notes (Signed)
SUBJECTIVE:   Deborah Mathis is a 61 year old female who complains of:  - horrible cold last week, somewhat better now, wanted to come in anyway  - maybe fevers last week, none since Saturday  - cough worse at night, feels really deep, not really coughing anything up  - not really SOB  - several family members had the flu  - hasn't needed inhaler, does note cold makes the cough worse  - taking nyquil daytime/nighttime     OBJECTIVE:  BP 138/65  Pulse 77  Temp 97.8 F (36.6 C) (Temporal)  Wt 86.2 kg (190 lb)  SpO2 100%  BMI 36.82 kg/m2  Pain Score: Data Unavailable  She appears well, vital signs are as noted. Ears normal.  Throat and pharynx normal.  Neck supple. No adenopathy in the neck. Nose is congested. Sinuses non tender. Heart RRR, no MRG. The chest is clear, without wheezes or rales.    ASSESSMENT:   bronchitis    PLAN:    (J20.9) Acute bronchitis, unspecified organism  (primary encounter diagnosis)  Comment:   Plan: benzonatate (TESSALON) 100 MG capsule,         albuterol (PROAIR HFA) 108 (90 Base) MCG/ACT         inhaler  - vitals stable, exam without red flags for acute process  - dc dayquil/nyquil and trial of tessalon + robitussin DM, also try albuterol if having spasmodic cough  - supportive care with fluids, humidity  - AVS provided and reviewed with further information  - reviewed signs/sx of worsening process requiring repeat visit, emergent process requiring ED        (E78.2) Elevated cholesterol with elevated triglycerides  Comment:   Plan: TRIGLYCERIDES, COLLECTION VENOUS BLOOD         VENIPUNCTURE  - due for fasting labs, has not eaten    (E55.9) Vitamin D deficiency  Comment:   Plan: VITAMIN D,25 HYDROXY, COLLECTION VENOUS BLOOD         VENIPUNCTURE  - as above

## 2016-12-16 NOTE — Patient Instructions (Signed)
Patient Education      Index Illustration     Acute Bronchitis   What is acute bronchitis?   Bronchitis is an infection of the air passages--that is, the tubes that connect the windpipe to the lungs. It causes swelling and irritation of the airways. With acute bronchitis you usually have a cough that produces phlegm and pain behind the breastbone when you breathe deeply or cough.  How does it occur?   Bronchitis often occurs with viral infections of the respiratory tract, such as colds and flu. Bronchitis may also be caused by bacterial infections. It may occur with childhood illnesses such as measles and whooping cough.  Attacks are frequent during the winter or when the level of air pollution is high.  Infants, young children, older adults, smokers, and people with heart disease or lung disease (including asthma and allergies) are most likely to get acute bronchitis.  What are the symptoms?   Symptoms may include:   a deep cough that produces yellowish or greenish phlegm    pain behind the breastbone when you breathe deeply or cough    wheezing    feeling short of breath    fever    chills    headache    sore muscles.   How is it diagnosed?   Your healthcare provider will ask about your symptoms and examine you. You may have tests, such as:   a test of phlegm to look for bacteria    chest X-ray    blood tests.   How is it treated?   Acute bronchitis often does not require medical treatment. All you may need to do to get better in a few days is:   Rest at home.    Drinking more liquids (water or tea) to help you cough up mucus more easily.   If your symptoms are severe or you have other health problems (such as heart or lung disease or diabetes), you may need to take antibiotics. If you are having wheezing, you may need an inhaler medicine to make it easier to breathe.  How long will the effects last?   Most of the time acute bronchitis clears up in a few days. Your cough may slowly get better in 1 to  2 weeks.  It may take you longer to recover if:   You are a smoker.    You live in an area where air pollution is a problem.    You have a heart or lung disease, including asthma.    You have any other on-going health problems.   How can I take care of myself?   You can help yourself by:   following the full treatment your healthcare provider recommends    using a humidifier or steam from hot water to add moisture to the air    drinking plenty of liquids    taking cough medicine if recommended by your healthcare provider    resting in bed    taking aspirin or acetaminophen to reduce fever and relieve headache and muscle pain (Check with your healthcare provider before you give any medicine that contains aspirin or salicylates to a child or teen. This includes medicines like baby aspirin, some cold medicines, and Pepto-Bismol. Children and teens who take aspirin are at risk for a serious illness called Reye's syndrome.)    eating healthy meals.   Call your healthcare provider if:   You have trouble breathing.    You have a fever higher  than 101.25F (38.6C).    You cough up blood.    Your symptoms are getting worse instead of better.    You don't start to feel better after 3 days of treatment.    You have any symptoms that concern you.   How can I help prevent acute bronchitis?   To reduce your risk of getting a respiratory infection:   Dont smoke.    Avoid secondhand smoke.    Wash your hands often, especially when you are around people with colds (upper respiratory infections).    If you have asthma or allergies, keep your symptoms under good control.    Get regular exercise.    Eat healthy foods.   Developed by [employer].  Adult Advisor 2012.1 published by [employer].  Last modified: 2009-10-19  Last reviewed: 2009-05-09  This content is reviewed periodically and is subject to change as new health information becomes available. The information is intended to inform and educate and is not  a replacement for medical evaluation, advice, diagnosis or treatment by a healthcare professional.  References   Adult Advisor 2012.1 Index    2012 [employer] and/or its affiliates. All rights reserved.

## 2016-12-25 ENCOUNTER — Encounter (HOSPITAL_BASED_OUTPATIENT_CLINIC_OR_DEPARTMENT_OTHER): Payer: Self-pay | Admitting: Internal Medicine

## 2016-12-25 ENCOUNTER — Ambulatory Visit (HOSPITAL_BASED_OUTPATIENT_CLINIC_OR_DEPARTMENT_OTHER): Payer: BLUE CROSS/BLUE SHIELD | Admitting: Internal Medicine

## 2016-12-25 VITALS — BP 127/68 | HR 74 | Temp 98.8°F

## 2016-12-25 DIAGNOSIS — M19041 Primary osteoarthritis, right hand: Secondary | ICD-10-CM | POA: Diagnosis not present

## 2016-12-25 DIAGNOSIS — E559 Vitamin D deficiency, unspecified: Secondary | ICD-10-CM

## 2016-12-25 DIAGNOSIS — E781 Pure hyperglyceridemia: Secondary | ICD-10-CM | POA: Diagnosis not present

## 2016-12-25 DIAGNOSIS — M19042 Primary osteoarthritis, left hand: Secondary | ICD-10-CM

## 2016-12-25 LAB — RBC SEDIMENTATION RATE: RBC SEDIMENTATION RATE: 26 MM/HR (ref 0–30)

## 2016-12-25 MED ORDER — NAPROXEN 500 MG PO TABS: 500 mg | tablet | Freq: Two times a day (BID) | ORAL | 1 refills | 0 days | Status: DC

## 2016-12-25 MED ORDER — NAPROXEN 500 MG PO TABS
500.0000 mg | ORAL_TABLET | Freq: Two times a day (BID) | ORAL | 1 refills | Status: DC
Start: 2016-12-25 — End: 2017-05-20

## 2016-12-25 MED ORDER — ERGOCALCIFEROL 1.25 MG (50000 UT) PO CAPS
50000.0000 [IU] | ORAL_CAPSULE | ORAL | 0 refills | Status: DC
Start: 2016-12-25 — End: 2017-02-23

## 2016-12-25 MED ORDER — ERGOCALCIFEROL 50000 UNITS PO CAPS: 50000 [IU] | capsule | ORAL | 0 refills | 0 days | Status: DC

## 2016-12-25 NOTE — Progress Notes (Signed)
Deborah Mathis is a 61 year old female  Pt presented to discuss her labs  Labs from July were positive for low Vit D and elevated triglycerides. Pt was prescribed 12 weeks vit D supplement. Also was recomended to try low fat and high fiber diet and repeat labs for triglycerides fasting.   Pt was in the office on 2/12 and had her labs repeated   Pt stayed that after finishing her 12 weeks high dose of vit D intake she feels  Better, less tired.   She complains on pain in her hands . Hard to bend fingers. Stiffness . Fingers became deformed . Takes Naproxen prn, helping a little. When weather get cold - hands really painful   OBJECTIVE:  BP 127/68  Pulse 74  Temp 98.8 F (37.1 C) (Temporal)  SpO2 97%  Appearance: NAD  Skin: no rash  Chest: good air entry b/l, no wheezing/rhonchi/crackles, no retractions/flaring  Heart: RRR, no M/R/G  Musculoskeletal: hands with some " swan neck" deformity.FROM but flexing fingers triggers pain .   Assessment:  (M19.041,  M19.042) Arthritis of both hands  (primary encounter diagnosis)  Comment:  causing pt's discomfort  Fu labs and rheumatology evaluation   Plan: naproxen (NAPROSYN) 500 MG tablet, REFERRAL TO         RHEUMATOLOGY ( INT), RHEUMATOID FACTOR, RBC         SEDIMENTATION RATE            (E55.9) Vitamin D deficiency  Comment:   VITAMIN D,25 HYDROXY (ng/mL)   Date Value   12/16/2016 21 (L)   05/22/2016 14 (*L)   ----------    Plan: ergocalciferol (VITAMIN D2) 50000 UNIT capsule        Then Vit D 2,000 U OTC. Repeat labs in a few months    (E78.1) High triglycerides  Comment: .05/22/16 = 184                      12/16/16 = 239  Plan: counseling on low fat and high fiber diet, increase exercises, repeat fasting  TRIGLYCERIDES    Return back if no improvement or worsening of the symptoms

## 2016-12-26 LAB — RHEUMATOID FACTOR TITER: RHEUMATOID FACTOR TITER: 1:8 {titer}

## 2016-12-26 LAB — RHEUMATOID FACTOR: RHEUMATOID FACTOR: POSITIVE — AB

## 2017-01-09 ENCOUNTER — Encounter (HOSPITAL_BASED_OUTPATIENT_CLINIC_OR_DEPARTMENT_OTHER): Payer: Self-pay | Admitting: Internal Medicine

## 2017-01-16 ENCOUNTER — Other Ambulatory Visit (HOSPITAL_BASED_OUTPATIENT_CLINIC_OR_DEPARTMENT_OTHER): Payer: Self-pay | Admitting: Medical

## 2017-01-16 MED ORDER — OMEPRAZOLE 20 MG PO CPDR
20.0000 mg | DELAYED_RELEASE_CAPSULE | Freq: Every day | ORAL | 0 refills | Status: DC | PRN
Start: 2017-01-16 — End: 2017-02-18

## 2017-01-16 MED ORDER — OMEPRAZOLE 20 MG PO CPDR: 20 mg | capsule | Freq: Every day | ORAL | 0 refills | 0 days | Status: DC | PRN

## 2017-01-16 NOTE — Telephone Encounter (Signed)
-----   Message from Juluis Mire sent at 01/16/2017  1:00 PM EDT -----  Regarding: Rx Refill Request  Contact: [phone]  [facility]    Person calling on behalf of patient: Pharmacy    May list multiple medications in this section    Medicine Name: omeprazole ([place]) 20 MG capsule    Pills left: Unknown    Documented patient preferred pharmacies:   [id number] [place], [place]  Phone: [phone] Fax: [phone]    CALL BACK NUMBER: [phone]

## 2017-01-16 NOTE — Progress Notes (Unsigned)
PER Pharmacy, Deborah Mathis is a 61 year old female has requested a refill of omeprazole 20mg .      Last Office Visit: 12/25/16 with Luiz Iron, E  Last Physical Exam: 06/10/16      Other Med Adult:  Most Recent BP Reading(s)  12/25/16 : 127/68          Cholesterol (mg/dL)   Date Value   05/22/2016 201   ----------    LOW DENSITY LIPOPROTEIN DIRECT (mg/dL)   Date Value   05/22/2016 139   ----------    HIGH DENSITY LIPOPROTEIN (mg/dL)   Date Value   05/22/2016 40   ----------    TRIGLYCERIDES (mg/dL)   Date Value   12/16/2016 239 (H)   ----------      No results found for: TSHSC      No results found for: TSH      HEMOGLOBIN A1C (%)   Date Value   05/22/2016 5.3   ----------    No results found for: POCA1C      No results found for: INR      SODIUM (mmol/L)   Date Value   05/22/2016 144   ----------      POTASSIUM (mmol/L)   Date Value   05/22/2016 4.3   ----------          CREATININE (mg/dL)   Date Value   05/22/2016 0.7   ----------    Documented patient preferred pharmacies:    [id number] [place], [place] - [place]  Phone: [phone] Fax: [phone]

## 2017-01-28 ENCOUNTER — Ambulatory Visit (HOSPITAL_BASED_OUTPATIENT_CLINIC_OR_DEPARTMENT_OTHER): Payer: Self-pay | Admitting: Internal Medicine

## 2017-01-28 ENCOUNTER — Ambulatory Visit (HOSPITAL_BASED_OUTPATIENT_CLINIC_OR_DEPARTMENT_OTHER): Payer: BLUE CROSS/BLUE SHIELD | Admitting: Internal Medicine

## 2017-01-28 VITALS — BP 132/66 | HR 81 | Temp 97.7°F | Ht 60.0 in | Wt 190.0 lb

## 2017-01-28 DIAGNOSIS — M79642 Pain in left hand: Principal | ICD-10-CM

## 2017-01-28 DIAGNOSIS — E781 Pure hyperglyceridemia: Secondary | ICD-10-CM | POA: Diagnosis not present

## 2017-01-28 DIAGNOSIS — G5603 Carpal tunnel syndrome, bilateral upper limbs: Secondary | ICD-10-CM | POA: Diagnosis not present

## 2017-01-28 DIAGNOSIS — M79641 Pain in right hand: Secondary | ICD-10-CM

## 2017-01-28 DIAGNOSIS — M19072 Primary osteoarthritis, left ankle and foot: Secondary | ICD-10-CM | POA: Diagnosis not present

## 2017-01-28 DIAGNOSIS — M79675 Pain in left toe(s): Secondary | ICD-10-CM

## 2017-01-28 DIAGNOSIS — M19041 Primary osteoarthritis, right hand: Secondary | ICD-10-CM | POA: Diagnosis not present

## 2017-01-28 DIAGNOSIS — M19042 Primary osteoarthritis, left hand: Secondary | ICD-10-CM | POA: Diagnosis not present

## 2017-01-28 DIAGNOSIS — G8929 Other chronic pain: Secondary | ICD-10-CM

## 2017-01-28 DIAGNOSIS — E559 Vitamin D deficiency, unspecified: Secondary | ICD-10-CM

## 2017-01-28 LAB — CBC WITH PLATELET
HEMATOCRIT: 40.5 % (ref 34.1–44.9)
HEMOGLOBIN: 13.2 g/dL (ref 11.2–15.7)
MEAN CORP HGB CONC: 32.6 g/dL (ref 31.0–37.0)
MEAN CORPUSCULAR HGB: 26.4 pg (ref 26.0–34.0)
MEAN CORPUSCULAR VOL: 81 fL (ref 80.0–100.0)
MEAN PLATELET VOLUME: 12 fL (ref 8.7–12.5)
PLATELET COUNT: 243 10*3/uL (ref 150–400)
RBC DISTRIBUTION WIDTH STD DEV: 41.2 fL (ref 35.1–46.3)
RBC DISTRIBUTION WIDTH: 14 % (ref 11.5–14.3)
RED BLOOD CELL COUNT: 5 M/uL (ref 3.90–5.20)
WHITE BLOOD CELL COUNT: 5.1 10*3/uL (ref 4.0–11.0)

## 2017-01-28 LAB — XR HAND LEFT MINIMUM 3 VIEWS

## 2017-01-28 LAB — XR HAND RIGHT MINIMUM 3 VIEWS

## 2017-01-28 LAB — XR FOOT LEFT MINIMUM 3 VIEWS

## 2017-01-28 MED ORDER — DICLOFENAC SODIUM 1 % TD GEL: 2 g | g | Freq: Four times a day (QID) | 2 refills | 0 days | Status: AC | PRN

## 2017-01-28 MED ORDER — DICLOFENAC SODIUM 1 % TD GEL
2.0000 g | Freq: Four times a day (QID) | TRANSDERMAL | 2 refills | Status: AC | PRN
Start: 2017-01-28 — End: 2017-04-28

## 2017-01-28 NOTE — Progress Notes (Signed)
[facility]  Rheumatology New Patient Note    Date of Visit: 01/28/2017    Patient: Deborah Mathis    PCP: Carolin Sicks MD    Primary Language: Cleophus Molt  Interpreter: none needed    Reason for Referral:  arthritis of the hands, persistent pain     Chief complaint:  Deborah Mathis is a 61 year old [place]- speaking female who presents with complaints of joint pain.      History of present illness:  I had the pleasure of meeting Deborah Mathis who is a 61 year old [place] speaking female with GERD and hypertension.    She describes bilateral elbow pain and hand pain.  Over the past 2 years hand pain has increased.  She has stiffness for an pain for only about 5-10 minutes in the morning.  She also has swelling in the morning last for a few minutes.  She has deformities in the hands similar to what her mother and sister have.  He takes naproxen every couple of days which is helpful.    Naproxen 500mg  every few days    She describes fatigue somewhat improved with taking vitamin D over the winter.    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      Allergies:  Review of Patient's Allergies indicates:  No Known Allergies    Medications:    Current Outpatient Prescriptions on File Prior to Visit:  omeprazole (PRILOSEC) 20 MG capsule Take 1 capsule by mouth daily as needed Disp: 30 capsule Rfl: 0   naproxen (NAPROSYN) 500 MG tablet Take 1 tablet by mouth 2 (two) times daily with meals As needed for arthritis pain Disp: 60 tablet Rfl: 1   ergocalciferol (VITAMIN D2) 50000 UNIT capsule Take 1 capsule by mouth once a week Disp: 12 capsule Rfl: 0   albuterol (PROAIR HFA) 108 (90 Base) MCG/ACT inhaler  Inhale 2 puffs into the lungs 4 (four) times daily as needed for Wheezing (cough) Use with upper resp infection Disp: 1 Inhaler Rfl: 2   hydrochlorothiazide (HYDRODIURIL) 25 MG tablet TAKE 1 TABLET BY MOUTH EVERY DAY Disp: 90 tablet Rfl: 3   Elastic Bandages & Supports (COMPRESSION STOCKINGS) 1 each by Other route daily Disp: 2 each Rfl: 11     No current facility-administered medications on file prior to visit.       Social history:    Social History  Social History   Marital status: Married  Spouse name: N/A    Years of education: N/A  Number of children: N/A     Occupational History  None on file     Social History Main Topics   Smoking status: Never Smoker    Smokeless tobacco: Not on file    Alcohol use No    Comment: 1 drin every 1-2 montrhs    Drug use: No    Sexual activity: Yes    Partners: Male     Other Topics Concern    Military Service No    Occupational Exposure No    Comment: repetitive motion hands  Sleep Concern Yes    Stress Concern No    Weight Concern Yes    Special Diet No    Comment: many vegetables    Exercise No    Comment: walking 90 min, while unemployed    Therapist, art Yes     Social History Narrative    Born: Born [place], came to [place] age 89    Family: lives with husband and son (b. 1991)    Education: has GED    Social: 3 bros and 1 sister living in [place] (one brother dec.d/t CAD)    Occupation:assembly line work    Leisure:    2012: just completed 20 years at her job        2013:    Lost job x 4 months; now working at [employer].        11/14/2014    NOw working [employer].    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    Habits: denies etoh, and tobacco    Feels safe at home: yes    Performs ADLS without  assistance: yes    Hand dominance: right    Occupation: inspects parts    Country of birth: [place]    Family History:      Family History    Heart Brother     Comment: deceased    Heart Brother     Heart Father     Comment: deceased    Heart Mother     Diabetes FamHxNeg     Cancer - Prostate Brother      Additional family history:  Niece SLE  Mother- arthritis  gout- 3 brothers      Pertinent rheumatic family history:    Denies known family history of the following:  Rheumatoid arthritis  Psoriasis  Inflammatory bowel disease  Thyroid disease  Hip fracture  Osteoporosis  Fibromyalgia syndrome  Muscle disease  Vasculitis  Scleroderma  Polymyalgia rheumatica  Ankylosing spondylitis    Gynecologic history:  g3p1  Menarche age 68.  First pregnancy at age 83.  2 miscarriages    Sexual HX:  Currently sexually active- yes  Men- 1    Health Maintenance:  Last eye exam- 2 years  Last dental exam- 6 months  Last colonoscopy- 5 years      ROS:     Review of Systems: Constitutional, Eyes, ENT/Mouth, Cardiovascular, Respiratory, GI, GU, Neuro, Psych, Heme/Lymph, Skin, Musculoskeletal, and Endocrine systems were reviewed and are NEGATIVE, except for what is documented in the note.    The patient filled out a new patient intake form to the best of their ability that includes PMHx, PSHx, family Hx, sexual Hx, gynecological Hx (for women), social history (includes habits, sleep, occupation, and hand dominance), health maintenance questions and a full review of systems.  This form has been scanned into the electronic medical record.  Please refer to it in conjunction with the new patient evaluation office visit note.  PE:   01/28/17  1115   BP: 132/66   Site: Left Arm   Position: Sitting   Cuff Size: Large   Pulse: 81   Temp: 97.7 F (36.5 C)   TempSrc: Oral   SpO2: 97%   Weight: 86.2 kg (190 lb)   Height: 5' (1.524 m)       GENERAL: NAD. Atraumatic/normocephalic. Alert and oriented. Good eye contact. Full affect. Follows commands  appropriately.  HEENT: There is no scalp rash. No malar rash. Anicteric sclerae. Noninjected conjunctivae. No nystagmus. Oropharynx clear. Tongue is midline. Symmetric palate raise.  Moist oropharyngeal mucosal membranes.  No ulcerations. No exudate. No erythema.  NECK: Supple. I do not appreciate lymphadenopathy.  LUNGS: Clear to auscultation without wheezes, rhonchi, or rales.  ABD: Soft. Non-tender.  EXTREMITIES: Well perfused. No edema. No clubbing.  SKIN: No rashes, nodules, bruising.  MUSCULOSKELETAL: She deformities in the DIPs and PIPs.  Cannot fully extend the fingers.  No synovitis appreciated in the hands.  Swelling of left third toe.  No synovitis in the knees.  NEURO: Narrow-based gait.  No weakness. No difficulty getting up from seated position without using hands or out of a supine position.  No assistive ambulatory devices.      Data:  Labs reviewed by me today:      WHITE BLOOD CELL COUNT (TH/uL)   Date Value   02/28/2015 5.5   ----------    RED BLOOD CELL COUNT (M/uL)   Date Value   02/28/2015 4.89   ----------    HEMOGLOBIN (g/dL)   Date Value   02/28/2015 13.0   ----------    HEMATOCRIT (%)   Date Value   02/28/2015 39.6   ----------    MEAN CORPUSCULAR VOL (fL)   Date Value   02/28/2015 81.0   ----------    MEAN CORPUSCULAR HGB (pg)   Date Value   02/28/2015 26.6   ----------    MEAN CORP HGB CONC (g/dL)   Date Value   02/28/2015 32.8   ----------    RBC DISTRIBUTION WIDTH (%)   Date Value   02/28/2015 13.9   ----------    PLATELET COUNT (TH/uL)   Date Value   02/28/2015 201   ---------      ALBUMIN (g/dL)   Date Value   05/22/2016 3.9   ----------    ALKALINE PHOSPHATASE (U/L)   Date Value   05/22/2016 76   ----------    ALANINE AMINOTRANSFERASE (U/L)   Date Value   05/22/2016 38   ----------    ASPARTATE AMINOTRANSFERASE (U/L)   Date Value   05/22/2016 21   ----------    BILIRUBIN TOTAL (mg/dL)   Date Value   05/22/2016 0.5   ----------    TOTAL PROTEIN (g/dL)   Date Value   05/22/2016 7.0    ----------    VITAMIN D,25 HYDROXY (ng/mL)   Date Value   12/16/2016 21 (L)   05/22/2016 14 (*L)   ----------    RBC SEDIMENTATION RATE (MM/HR)   Date Value   12/25/2016 26   10/12/2010 11   ----------      RHEUMATOID FACTOR (no units)   Date Value   12/25/2016 POSITIVE (A)   01/22/2010 NEGATIVE   ----------    URIC ACID (mg/dl)   Date Value   01/22/2010 4.7   ----------      Imaging reviewed personally by me today:    Hand x-rays      Assessment: Deborah Frederickson  Mathis is a 61 year old [place]- speaking female  With bilateral hand pain and deformities.  He has mild stiffness and swelling in the morning.  Hand deformities look like osteoarthritis, although it is possible to have concomitant rheumatoid arthritis, especially with the low value rheumatoid factor.    Plan/Recommendations:  1.  X-rays of the hands and left foot today to evaluate for erosive process.  2.  I recommend taking naproxen and acetaminophen as needed.  3.  She will try topical diclofenac  4.  Update basic labs and check CCP antibody.    I discussed the likely diagnosis(es),  prognosis(es), and the various treatment options in detail with the patient, including observation.     Risks and benefits of the treatment plan(s) were discussed.     The patient's questions have been addressed and answered.     We discussed importance of medication compliance. The patient was ready to learn and no apparent learning barriers were identified.     Follow-up in 1 week, earlier if needed.    Deborah Mathis verbally expressed an understanding and agreement with the plan as outlined above.    Thank you for your kind referral.  I appreciate the opportunity to participate in [facility] care.  Please contact me with any questions that you may have.    This dictation was made using voice recognition software.  There is potential for transcription errors and incorrect word usage based on limitations of the software.      Vinnie Level L. Lorane Gell, MD, MPH, Julious Payer

## 2017-01-28 NOTE — Progress Notes (Signed)
61 y/o old female with no significant past medical history who presents to clinic for complaint of bilateral hand pain. This pain started 5-6 years ago, with some swelling to the hands, with numbness and tingling, when she woke up in the morning. Also elbow pain, but stopped two years ago. She previously took ibuprofen, which did seem to help. Two years ago she started having more discomfort to her hands, which is the site of her primary issues. She still has stiffness in the mornings, but after 10 minutes she is able to make a fist. She currently goes 3-4 days without taking Naproxen, but the Naproxen does alleviate her pain. No numbness and tingling. No color changes to the hands. No fevers, headaches, changes vision.    Current medications:  Vit D 50,000 IU once a week   Naproxen '500mg'$  as needed     Labs:   Vitamin D 12/16/2016 21  RF Positive, 1:8  ESR 26    Deviated PIPJ and DIPJ 2nd digits bilaterally  Mild swelling at the PIPJ bilaterally

## 2017-01-28 NOTE — Addendum Note (Signed)
Addended by: Remi Haggard on: 01/28/2017 12:55 PM     Modules accepted: Orders

## 2017-01-30 LAB — COMPREHENSIVE METABOLIC PANEL
ALANINE AMINOTRANSFERASE: 42 U/L (ref 12–45)
ALBUMIN: 3.9 g/dL (ref 3.4–5.0)
ALKALINE PHOSPHATASE: 89 U/L (ref 45–117)
ANION GAP: 7 mmol/L (ref 5–15)
ASPARTATE AMINOTRANSFERASE: 22 U/L (ref 8–34)
BILIRUBIN TOTAL: 0.6 mg/dL (ref 0.2–1.0)
BUN (UREA NITROGEN): 16 mg/dL (ref 7–18)
CALCIUM: 9 mg/dL (ref 8.5–10.1)
CARBON DIOXIDE: 31 mmol/L (ref 21–32)
CHLORIDE: 105 mmol/L (ref 98–107)
CREATININE: 0.6 mg/dL (ref 0.4–1.2)
ESTIMATED GLOMERULAR FILT RATE: >60 mL/min (ref >60)
Glucose Random: 75 mg/dL (ref 74–160)
POTASSIUM: 3.4 mmol/L — ABNORMAL LOW (ref 3.5–5.1)
SODIUM: 143 mmol/L (ref 136–145)
TOTAL PROTEIN: 7.3 g/dL (ref 6.4–8.2)

## 2017-01-30 LAB — HEPATITIS B SURFACE AB QUANT: HEPATITIS B SURFACE AB QUANT: <3.1 m[IU]/mL (ref 0.00–9.99)

## 2017-01-30 LAB — VITAMIN D,25 HYDROXY: VITAMIN D,25 HYDROXY: 22 ng/mL — ABNORMAL LOW (ref 30.0–100.0)

## 2017-01-30 LAB — CCP ANTIBODIES IGG/IGA: CCP ANTIBODIES IGG/IGA: 6 units (ref 0–19)

## 2017-01-30 LAB — TRIGLYCERIDES: TRIGLYCERIDES: 233 mg/dL — ABNORMAL HIGH (ref 0–150)

## 2017-01-30 LAB — HEPATITIS C ANTIBODY: HEPATITIS C ANTIBODY: NONREACTIVE

## 2017-01-30 LAB — HEPATITIS B SURFACE ANTIGEN: HEPATITIS B SURFACE ANTIGEN: NONREACTIVE

## 2017-01-30 LAB — URIC ACID: URIC ACID: 6.8 mg/dL — ABNORMAL HIGH (ref 2.6–6.0)

## 2017-01-30 LAB — HEPATITIS B CORE ANTIBODY: HEPATITIS B CORE ANTIBODY: NONREACTIVE

## 2017-02-03 ENCOUNTER — Telehealth (HOSPITAL_BASED_OUTPATIENT_CLINIC_OR_DEPARTMENT_OTHER): Payer: Self-pay | Admitting: Internal Medicine

## 2017-02-03 NOTE — Progress Notes (Signed)
Patient has [employer] for prescription coverage  Member ID # [id number]  [place] Phone [phone], Spoke with [facility]    DICLOFENAC has been APPROVED for 1 year ([id number])      The patient and their preferred pharmacy are aware of the approval.

## 2017-02-03 NOTE — Progress Notes (Signed)
[facility]    Prior authorization request for Diclofenac 1% Gel    Patient has BC/BS for prescription coverage.  I.D.# [id number]  Phone number: [phone]

## 2017-02-10 ENCOUNTER — Encounter (HOSPITAL_BASED_OUTPATIENT_CLINIC_OR_DEPARTMENT_OTHER): Payer: Self-pay | Admitting: Internal Medicine

## 2017-02-18 ENCOUNTER — Other Ambulatory Visit (HOSPITAL_BASED_OUTPATIENT_CLINIC_OR_DEPARTMENT_OTHER): Payer: Self-pay | Admitting: Internal Medicine

## 2017-02-18 NOTE — Progress Notes (Signed)
PER Pharmacy, Deborah Mathis is a 61 year old female has requested a refill of omeprazole 20mg .      Last Office Visit: 12/25/2016 with Luiz Iron, E  Last Physical Exam: 06/10/2017      Other Med Adult:  Most Recent BP Reading(s)  01/28/17 : 132/66          Cholesterol (mg/dL)   Date Value   05/22/2016 201   ----------    LOW DENSITY LIPOPROTEIN DIRECT (mg/dL)   Date Value   05/22/2016 139   ----------    HIGH DENSITY LIPOPROTEIN (mg/dL)   Date Value   05/22/2016 40   ----------    TRIGLYCERIDES (mg/dL)   Date Value   01/28/2017 233 (H)   ----------      No results found for: TSHSC      No results found for: TSH      HEMOGLOBIN A1C (%)   Date Value   05/22/2016 5.3   ----------    No results found for: POCA1C      No results found for: INR      SODIUM (mmol/L)   Date Value   01/28/2017 143   ----------      POTASSIUM (mmol/L)   Date Value   01/28/2017 3.4 (L)   ----------          CREATININE (mg/dL)   Date Value   01/28/2017 0.6   ----------    Documented patient preferred pharmacies:    [id number] [place], [place] [facility] [place]  Phone: [phone] Fax: [phone]

## 2017-02-23 ENCOUNTER — Other Ambulatory Visit (HOSPITAL_BASED_OUTPATIENT_CLINIC_OR_DEPARTMENT_OTHER): Payer: Self-pay | Admitting: Internal Medicine

## 2017-02-23 ENCOUNTER — Encounter (HOSPITAL_BASED_OUTPATIENT_CLINIC_OR_DEPARTMENT_OTHER): Payer: Self-pay | Admitting: Internal Medicine

## 2017-02-23 DIAGNOSIS — E559 Vitamin D deficiency, unspecified: Secondary | ICD-10-CM

## 2017-02-23 MED ORDER — ERGOCALCIFEROL 1.25 MG (50000 UT) PO CAPS
50000.0000 [IU] | ORAL_CAPSULE | ORAL | 0 refills | Status: DC
Start: 2017-02-23 — End: 2017-05-20

## 2017-02-23 MED ORDER — ERGOCALCIFEROL 50000 UNITS PO CAPS: 50000 [IU] | capsule | ORAL | 0 refills | 0 days | Status: DC

## 2017-03-20 ENCOUNTER — Other Ambulatory Visit (HOSPITAL_BASED_OUTPATIENT_CLINIC_OR_DEPARTMENT_OTHER): Payer: Self-pay | Admitting: Internal Medicine

## 2017-03-20 DIAGNOSIS — E559 Vitamin D deficiency, unspecified: Secondary | ICD-10-CM

## 2017-03-20 NOTE — Progress Notes (Signed)
PER Pharmacy, Deborah Mathis is a 61 year old female has requested a refill of vitamin D 50000.      Last Office Visit: 12/25/2016 with Luiz Iron, E  Last Physical Exam: 06/10/2016      Other Med Adult:  Most Recent BP Reading(s)  01/28/17 : 132/66          Cholesterol (mg/dL)   Date Value   05/22/2016 201   ----------    LOW DENSITY LIPOPROTEIN DIRECT (mg/dL)   Date Value   05/22/2016 139   ----------    HIGH DENSITY LIPOPROTEIN (mg/dL)   Date Value   05/22/2016 40   ----------    TRIGLYCERIDES (mg/dL)   Date Value   01/28/2017 233 (H)   ----------      No results found for: TSHSC      No results found for: TSH      HEMOGLOBIN A1C (%)   Date Value   05/22/2016 5.3   ----------    No results found for: POCA1C      No results found for: INR      SODIUM (mmol/L)   Date Value   01/28/2017 143   ----------      POTASSIUM (mmol/L)   Date Value   01/28/2017 3.4 (L)   ----------          CREATININE (mg/dL)   Date Value   01/28/2017 0.6   ----------    Documented patient preferred pharmacies:    [id number] [place], [place] - [place]  Phone: [phone] Fax: [phone]

## 2017-04-29 ENCOUNTER — Other Ambulatory Visit (HOSPITAL_BASED_OUTPATIENT_CLINIC_OR_DEPARTMENT_OTHER): Payer: Self-pay | Admitting: Internal Medicine

## 2017-04-29 MED ORDER — OMEPRAZOLE 20 MG PO CPDR
DELAYED_RELEASE_CAPSULE | ORAL | 3 refills | Status: DC
Start: 2017-04-29 — End: 2018-05-08

## 2017-04-29 MED ORDER — OMEPRAZOLE 20 MG PO CPDR: capsule | ORAL | 3 refills | 0 days | Status: DC

## 2017-04-29 NOTE — Telephone Encounter (Signed)
-----   Message from Juluis Mire sent at 04/29/2017 12:32 PM EDT -----  Regarding: Rx Refill Request - 73 DAY SUPPLY  Contact: [phone]  [facility]    Person calling on behalf of patient: Pharmacy    May list multiple medications in this section - [place]    Medicine Name: omeprazole ([place]) 20 MG capsule    Pills left: Unknown    Documented patient preferred pharmacies:   [id number] [place], [place] - [place]  Phone: [phone] Fax: [phone]    CALL BACK NUMBER: [phone]

## 2017-04-29 NOTE — Progress Notes (Signed)
PER Pharmacy, Deborah Mathis is a 61 year old female has requested a refill of omeprazole 20    PHARMACY HAS REQUESTED A 90 DAY SUPPLY    Last Office Visit: 12-25-16 with Gasper Sells   Last Physical Exam: 06-10-16    Other Med Adult:  Most Recent BP Reading(s)  01/28/17 : 132/66          Cholesterol (mg/dL)   Date Value   05/22/2016 201   ----------    LOW DENSITY LIPOPROTEIN DIRECT (mg/dL)   Date Value   05/22/2016 139   ----------    HIGH DENSITY LIPOPROTEIN (mg/dL)   Date Value   05/22/2016 40   ----------    TRIGLYCERIDES (mg/dL)   Date Value   01/28/2017 233 (H)   ----------      No results found for: TSHSC      No results found for: TSH      HEMOGLOBIN A1C (%)   Date Value   05/22/2016 5.3   ----------    No results found for: POCA1C      No results found for: INR      SODIUM (mmol/L)   Date Value   01/28/2017 143   ----------      POTASSIUM (mmol/L)   Date Value   01/28/2017 3.4 (L)   ----------          CREATININE (mg/dL)   Date Value   01/28/2017 0.6   ----------    Documented patient preferred pharmacies:    [id number] [place], [place] - [place]  Phone: [phone] Fax: [phone]

## 2017-05-08 ENCOUNTER — Ambulatory Visit (HOSPITAL_BASED_OUTPATIENT_CLINIC_OR_DEPARTMENT_OTHER): Payer: BLUE CROSS/BLUE SHIELD | Admitting: Internal Medicine

## 2017-05-20 ENCOUNTER — Ambulatory Visit (HOSPITAL_BASED_OUTPATIENT_CLINIC_OR_DEPARTMENT_OTHER): Payer: BLUE CROSS/BLUE SHIELD | Admitting: Internal Medicine

## 2017-05-20 ENCOUNTER — Encounter (HOSPITAL_BASED_OUTPATIENT_CLINIC_OR_DEPARTMENT_OTHER): Payer: Self-pay

## 2017-05-20 VITALS — BP 136/74 | HR 65 | Temp 98.1°F | Wt 191.0 lb

## 2017-05-20 DIAGNOSIS — E559 Vitamin D deficiency, unspecified: Secondary | ICD-10-CM

## 2017-05-20 DIAGNOSIS — M19042 Primary osteoarthritis, left hand: Secondary | ICD-10-CM | POA: Diagnosis not present

## 2017-05-20 DIAGNOSIS — M19041 Primary osteoarthritis, right hand: Secondary | ICD-10-CM | POA: Insufficient documentation

## 2017-05-20 MED ORDER — ERGOCALCIFEROL 1.25 MG (50000 UT) PO CAPS
50000.0000 [IU] | ORAL_CAPSULE | ORAL | 0 refills | Status: DC
Start: 2017-05-20 — End: 2020-07-21

## 2017-05-20 MED ORDER — NAPROXEN 500 MG PO TABS
500.0000 mg | ORAL_TABLET | Freq: Two times a day (BID) | ORAL | 1 refills | Status: DC
Start: 2017-05-20 — End: 2017-07-31

## 2017-05-20 MED ORDER — ERGOCALCIFEROL 50000 UNITS PO CAPS: 50000 [IU] | capsule | ORAL | 0 refills | 0 days | Status: AC

## 2017-05-20 MED ORDER — NAPROXEN 500 MG PO TABS: 500 mg | tablet | Freq: Two times a day (BID) | ORAL | 1 refills | 0 days | Status: DC

## 2017-05-20 NOTE — Progress Notes (Signed)
Deborah Mathis is a 61 year old female  Pt presented with a request for refill of naproxen that helps to her hands arthritis.   She was scheduled with Dr Lorane Gell on 7/5/but it canceled by provider   Pt going to [place] for 2 weeks and needs a refill     Also required a refill of vit D 50,000 U weekly. Stays it helps her to feel better    VITAMIN D,25 HYDROXY (ng/mL)   Date Value   01/28/2017 22 (L)   12/16/2016 21 (L)   05/22/2016 14 (*L)   ----------    Otherwise pt is doing fine   Takes all of her meds . Denies side effect     Pt prescribed Tylenol #3 in 04/2016 for 1 10 days . Not taking it any more. Was offered to sign contract .   OBJECTIVE:  BP 136/74  Pulse 65  Temp 98.1 F (36.7 C) (Temporal)  Wt 86.6 kg (191 lb)  SpO2 100%  BMI 37.3 kg/m2  Appearance: NAD  Skin: no rash  Chest: good air entry b/l, no wheezing/rhonchi/crackles, no retractions/flaring  Heart: RRR, no M/R/G  Musculoskeletal: some of the fingers with " swan neck " deformity. FROM but flexing fringes causing discomfort   Assessment:  (M19.041,  M19.042) Arthritis of both hands  (primary encounter diagnosis)  Comment: NSAIDs helps control the pain   Refill   Plan: naproxen (NAPROSYN) 500 MG tablet with food , prn     (E55.9) Vitamin D deficiency  Comment: refill  Labs will be repeat with next labs   Plan: ergocalciferol (VITAMIN D2) 50000 UNIT capsule,        VITAMIN D,25 HYDROXY          Return back if no improvement or worsening of the symptoms

## 2017-05-22 ENCOUNTER — Other Ambulatory Visit (HOSPITAL_BASED_OUTPATIENT_CLINIC_OR_DEPARTMENT_OTHER): Payer: Self-pay | Admitting: Internal Medicine

## 2017-05-22 DIAGNOSIS — E559 Vitamin D deficiency, unspecified: Secondary | ICD-10-CM

## 2017-05-22 NOTE — Progress Notes (Signed)
Med on file with Pharmacy:  [facility] that VITAMIN D2 50000 UNIT CAP has RX WITH active refills remaining. No refill is required at this time.

## 2017-06-23 ENCOUNTER — Ambulatory Visit (HOSPITAL_BASED_OUTPATIENT_CLINIC_OR_DEPARTMENT_OTHER): Payer: BLUE CROSS/BLUE SHIELD | Admitting: Internal Medicine

## 2017-06-23 ENCOUNTER — Encounter (HOSPITAL_BASED_OUTPATIENT_CLINIC_OR_DEPARTMENT_OTHER): Payer: Self-pay | Admitting: Internal Medicine

## 2017-06-23 VITALS — BP 126/65 | HR 74 | Temp 98.0°F | Ht 60.0 in | Wt 189.0 lb

## 2017-06-23 DIAGNOSIS — Z0000 Encounter for general adult medical examination without abnormal findings: Principal | ICD-10-CM

## 2017-06-23 DIAGNOSIS — I872 Venous insufficiency (chronic) (peripheral): Secondary | ICD-10-CM

## 2017-06-23 DIAGNOSIS — R600 Localized edema: Secondary | ICD-10-CM | POA: Diagnosis not present

## 2017-06-23 MED ORDER — HYDROCHLOROTHIAZIDE 12.5 MG PO TABS
12.5000 mg | ORAL_TABLET | Freq: Every day | ORAL | 2 refills | Status: DC
Start: 2017-06-23 — End: 2017-06-23

## 2017-06-23 MED ORDER — HYDROCHLOROTHIAZIDE 25 MG PO TABS: 25 mg | tablet | Freq: Every day | ORAL | 11 refills | 0 days | Status: DC

## 2017-06-23 MED ORDER — HYDROCHLOROTHIAZIDE 25 MG PO TABS
25.0000 mg | ORAL_TABLET | Freq: Every day | ORAL | 11 refills | Status: DC
Start: 2017-06-23 — End: 2018-04-10

## 2017-06-23 MED ORDER — HYDROCHLOROTHIAZIDE 12.5 MG PO TABS: 13 mg | tablet | Freq: Every day | ORAL | 2 refills | 0 days | Status: DC

## 2017-06-23 NOTE — Progress Notes (Signed)
Chief Complaint:  Deborah Mathis is a 61 year old female who presents for a physical exam.  Patient denies any current related  concerns.  Married  Son 27 Vicente Males , lives with parents   Safe at home   Try to avoid carbs . Has to cook for family   Walks in lunch time and brake about every day . For 20-25 min   + sit belt   Using sun block lotion sometimes   Works : Multimedia programmer , Agricultural consultant. Likes it. Works full time   [place]: taking her 61 y/o Industrial/product designer , share food that cooks for family . Runs the house . Goes to sister, seeing little nieces       Complains on feet swelling . Got back from the trip a week ago .   Patient Active Problem List:     Carpal tunnel syndrome     Nonspecific reaction to tuberculin skin test without active tuberculosis     Iron deficiency anemia     Uterine fibroid     Hydradenitis     Seasonal allergies     Vitamin D deficiency     Arthritis of both hands        Current Outpatient Prescriptions:  naproxen (NAPROSYN) 500 MG tablet Take 1 tablet by mouth 2 (two) times daily with meals As needed for arthritis pain Disp: 60 tablet Rfl: 1   ergocalciferol (VITAMIN D2) 50000 UNIT capsule Take 1 capsule by mouth once a week Disp: 12 capsule Rfl: 0   omeprazole (PRILOSEC) 20 MG capsule TAKE ONE CAPSULE BY MOUTH EVERY DAY AS NEEDED Disp: 90 capsule Rfl: 3   albuterol (PROAIR HFA) 108 (90 Base) MCG/ACT inhaler Inhale 2 puffs into the lungs 4 (four) times daily as needed for Wheezing (cough) Use with upper resp infection Disp: 1 Inhaler Rfl: 2   hydrochlorothiazide (HYDRODIURIL) 25 MG tablet TAKE 1 TABLET BY MOUTH EVERY DAY Disp: 90 tablet Rfl: 3     No current facility-administered medications for this visit.     Allergies:  Review of Patient's Allergies indicates:  No Known Allergies    Health Maintenance:  ZOSTER VACCINE,AGE 35 AND OLDER (ONCE) due on 11/29/2015  PHYSICAL EXAM (AGE 4+) due on 06/10/2017  INFLUENZA VACCINE(1) due on 07/05/2017  PHQ-9 due on 05/20/2018  AWQ  Questionnaire due on 05/20/2018  MAMMOGRAPHY due on 05/28/2018  PAP SMEAR due on 02/28/2020  HPV SCREENING due on 02/28/2020  COLONOSCOPY due on 10/12/2020  HEALTH CARE PROXY due on 05/01/2021  LIPID SCREENING due on 05/22/2021  TDAP/TD VACCINE(2 - Td) due on 12/16/2026  HIV SCREENING Completed  HEP B HIGH RISK VACCINE EVAL (ONCE) Completed  HEP C SCREEN (DOB [birth year]) Completed    Immunizations:  Immunization History   Administered Date(s) Administered    INFLUENZA VIRUS TRI W/PRESV VACCINE 18/> YRS IM (PRIVATE) 08/04/2016    Td 05/22/2016   Deferred Date(s) Deferred    Tdap 01/31/2010       Histories:  No past medical history on file.  No past surgical history on file.    Social History  Social History   Marital status: Married  Spouse name: N/A    Years of education: N/A  Number of children: N/A     Occupational History  None on file     Social History Main Topics   Smoking status: Never Smoker    Smokeless tobacco: Never Used    Alcohol use No    Comment: 1 drin  every 1-2 montrhs    Drug use: No    Sexual activity: Yes    Partners: Male     Other Topics Concern    Military Service No    Occupational Exposure No    Comment: repetitive motion hands    Sleep Concern Yes    Stress Concern No    Weight Concern Yes    Special Diet No    Comment: many vegetables    Exercise No    Comment: walking 90 min, while unemployed    Therapist, art Yes     Social History Narrative    Born: Born [place], came to [place] age 3    Family: lives with husband and son (b. 1991)    Education: has GED    Social: 3 bros and 1 sister living in [place] (one brother dec.d/t CAD)    Occupation:assembly line work    Leisure:    2012: just completed 20 years at her job        2013:    Lost job x 4 months; now working at [employer].        11/14/2014    NOw working [employer].    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     Married, son is 43 y/o ,  still lives with parents     No pets    Do not follow  diet     No exercising                            Family History    Heart Brother     Comment: deceased    Heart Brother     Heart Father     Comment: deceased    Heart Mother     Diabetes FamHxNeg     Cancer - Prostate Brother        Review of Systems:                   Skin: negative, rash and bruising  Eyes: negative, double vision and eye pain  Ears/Nose/Throat: negative, sinus trouble, dental problem, ear pain and mouth sores  Respiratory: negative, cough, wheezing and shortness of breath  Cardiovascular: negative, tachycardia, chest pain. Positive for  lower extremity edema  Gastrointestinal: negative, dysphagia, abdominal pain, constipation and diarrhea  Genitourinary: negative, dysuria and hematuria  PIR:JJOACZ menses, no abnormal bleeding, pelvic pain or discharge  no breast pain or new or enlarging lumps on self exam  no vaginal bleeding  breast pain  Musculoskeletal: negative, muscular weakness and joint pain. Light puffiness of ankles and feet, L>R  Neurologic: negative, syncope, seizures and vertigo  Endocrine: negative, cold intolerance and heat intolerance  Psychiatric: negative, depression, hallucinatins and delusions  Hematologic/Lymphatic/Immunologic: negative, allergies and lymphadenopathy    Physical:  BP 126/65 (Site: LA, Position: Sitting, Cuff Size: Reg)  Pulse 74  Temp 98 F (36.7 C) (Temporal)  Ht 5' (1.524 m)  Wt 85.7 kg (189 lb)  LMP 04/05/2005  SpO2 98%  BMI 36.91 kg/m2  General appearance: healthy, alert, well developed, well nourished  Eyes: conjunctivae/corneas clear. PERRL, EOM's intact. Fundi benign  Skin: skin color, texture, turgor are normal  Head: Normocephalic. No masses, lesions, tenderness or abnormalities  Ears: External ears normal. Canals clear. TM's normal.  Nose/Sinuses:  Nares normal. Septum midline. Mucosa normal. No drainage or sinus tenderness.  Oropharynx: Lips, mucosa, and tongue normal. Teeth and gums normal.  Oropharynx moist and without lesion  Neck: Neck supple. No adenopathy. Thyroid symmetric, normal size, and without nodularity  Back: Back symmetric, no curvature. ROM normal. No CVA tenderness.  Lungs: Percussion normal. Good diaphragmatic excursion. Lungs clear to auscultation bilaterally  Heart: PMI normal. No lifts, heaves, or thrills. RRR. No murmurs, clicks, gallops or rubs  Breast: breasts symmetric, no dominant or suspicious mass, no skin or nipple changes, no axillary adenopathy and self exam in taught and encouraged  Abdomen: Abdomen soft, non-tender. BS normal. No masses, no organomegaly  Extremities: Extremities normal. No deformities, edema, or skin discoloration  Musculoskeletal: Spine ROM normal. Muscular strength intact.  Peripheral pulses: radial=4/4, femoral=4/4, popliteal=4/4, dorsalis pedis=4/4  Neuro: Gait normal. Reflexes normal and symmetric. Sensation grossly normal   Pelvic: deferred. Last PAP on 02/2015, next in 5 years  Rectal: deferred     Health Counseling:  Smoking:  Reviewed and Discussed  Substance Use Issues:   Reviewed and Discussed   Seat Belts:  Reviewed and Discussed  Protective Sports Equipment:  Reviewed and Discussed  Smoke Dectectors:  Reviewed and Discussed  Diet, Exercise, Wt. Control:  Reviewed and Discussed  Dental Health:  Reviewed and Discussed  Vision Health:  Reviewed and Discussed  Mental Health:  Reviewed and Discussed  Domestic Violence:  Reviewed and Discussed  Sexual Health:  Reviewed and Discussed  BSE:  Reviewed and Discussed  Osteoperosis: Reviewed and Discussed  Health Care Proxy:  Reviewed and Discussed      ASSESSMENT/PLAN:  (Z00.00) Routine general medical examination at a health care facility  (primary encounter diagnosis)  Comment: generally in good health  Plan: up to ate with PAP, mammogram, colonoscopy     (R60.0) Edema of leg  Comment: minimal. Possibly due to venous insufficiency  Plan: hydrochlorothiazide (HYDRODIURIL) 25 MG tablet prescribed for  hypertension . Continue         Compressive stockings discussed     (I87.2) Venous (peripheral) insufficiency  Comment: possibly trigger puffiness of feet   Plan: Compressive stockings discussed     Counseling on safety issues such as  sun protection, safe driving,safe sex, osteoporosis prophylactics, healthy eating habits, abstinences from alcohol and smoking discussed

## 2017-06-23 NOTE — Patient Instructions (Signed)
[facility]  [facility]  [address]  [place] [address]  Clinical Nutrition Services    Calcium and Your Health    Calcium is a mineral that helps build strong bones and teeth, helps prevent brittle bones (osteoporosis) and hip fractures, and helps maintain a normal blood pressure. It is very important to get enough calcium each day.    * CALCIUM NEEDS AT ALL AGES: (RDI 2000)  Infants birth to 12 months  is 210 -270 mg/day          Child 1-8 years  is 500-.800 mg/day                     Adolescent 9-18years is 1300 mg/day                   Adults 19-30 years is 1000 mg/day  Adults 31-50 years is 1000 mg/day  Adults 51 and older is 1200 mg/day    * TO MAINTAIN GOOD BONE HEALTH, EAT A CALCIUM RICH DIET:    FOODS                                      Yogurt, plain, nonfat: 1 cup serving is 450 mgs of calcium    Ricotta cheese, part skim: 1/2 cup serving is 340 mgs of calcium    Milk, skim: 1 cup serving is 300 mgs of calcium    Milk, whole: 1 cup serving is 290 mgs of calcium    Sardines, canned, with bones: 3 oz serving is 280 mgs of calcium    Yogurt, plain, whole milk:1 cup serving is 275 mgs of calcium    Orange juice, calcium fortified:1 cup serving is 270 mgs of calcium    Swiss cheese: 1 oz serving is 270 mgs of calcium    Spinach, cooked: 1 cup serving is 240 mgs of calcium   Turnip greens, rhubarb, cooked:1 cup serving is 200 mgs of calcium   Salmon, canned, with bones: 3 oz serving is 180 mgs of calcium   Ice Cream: 1 cup serving is 175 mgs of calcium   Pudding, instant mix: 1/2 cup serving is 150 mgs of calcium   Almonds:  /2 cup serving is 150 mgs of calcium   Kale, cooked: 1 cup serving is 100 mgs of calcium   Lobster, cooked: 6 oz serving is 100 mgs of calcium   Tofu, with calcium sulfate:  3oz serving is 100 mgs of calcium     EXERCISE: Weight bearing exercises, such as walking, jogging, racquet sports, aerobics, and weight training  help make bones stronger.      VITAMIN D: Vitamin D is essential to help your body absorb calcium. A healthy body can make its own vitamin D with the help of sunlight. But in the winter months, you may not get enough sun, and should make   sure you get it from another source, like milk or a multivitamin.     HORMONES: The hormone estrogen helps the female body and bones to use calcium. After menopause women need to discuss hormone treatment with their doctor.    * This is just a first step in helping improve your health.  * For help with a meal plan for you, make an appointment with the Nutritionist.  * For more information, you can also contact the National Nutrition Hotline   1-800-366-1655                                                              9/95 MAS 1/96, 12/97eqj, 01/02, 4/02

## 2017-07-31 ENCOUNTER — Other Ambulatory Visit (HOSPITAL_BASED_OUTPATIENT_CLINIC_OR_DEPARTMENT_OTHER): Payer: Self-pay | Admitting: Internal Medicine

## 2017-07-31 DIAGNOSIS — M19042 Primary osteoarthritis, left hand: Secondary | ICD-10-CM

## 2017-07-31 DIAGNOSIS — M19041 Primary osteoarthritis, right hand: Secondary | ICD-10-CM

## 2017-07-31 MED ORDER — NAPROXEN 500 MG PO TABS: 500 mg | tablet | Freq: Two times a day (BID) | ORAL | 0 refills | 0 days | Status: DC

## 2017-07-31 MED ORDER — NAPROXEN 500 MG PO TABS
500.0000 mg | ORAL_TABLET | Freq: Two times a day (BID) | ORAL | 0 refills | Status: DC
Start: 2017-07-31 — End: 2018-04-10

## 2017-07-31 NOTE — Telephone Encounter (Signed)
-----   Message from Juluis Mire sent at 07/31/2017 11:52 AM EDT -----  Regarding: Rx Refill Request - 33 DAYS SUPPLY  Contact: [phone]  [facility]    Person calling on behalf of patient: Pharmacy    May list multiple medications in this section - [place]    Medicine Name: naproxen (NAPROSYN) 500 MG tablet    Pills left: Unknown    Documented patient preferred pharmacies:   [id number] [place], [place] - [place]  Phone: [phone] Fax: [phone]    CALL BACK NUMBER: [phone]

## 2017-07-31 NOTE — Progress Notes (Signed)
PER Pharmacy, Deborah Mathis is a 61 year old female has requested a refill of Naproxen 500 mg.    Please note: pharmacy is requesting a 90 days supply per Patient      Last Office Visit: 06-23-2017 with Gasper Sells  Last Physical Exam: 06-23-2017      Other Med Adult:  Most Recent BP Reading(s)  06/23/17 : 126/65        Cholesterol (mg/dL)   Date Value   05/22/2016 201     LOW DENSITY LIPOPROTEIN DIRECT (mg/dL)   Date Value   05/22/2016 139     HIGH DENSITY LIPOPROTEIN (mg/dL)   Date Value   05/22/2016 40     TRIGLYCERIDES (mg/dL)   Date Value   01/28/2017 233 (H)         No results found for: TSHSC      No results found for: TSH    HEMOGLOBIN A1C (%)   Date Value   05/22/2016 5.3       No results found for: POCA1C      No results found for: INR    SODIUM (mmol/L)   Date Value   01/28/2017 143       POTASSIUM (mmol/L)   Date Value   01/28/2017 3.4 (L)           CREATININE (mg/dL)   Date Value   01/28/2017 0.6       Documented patient preferred pharmacies:    [id number] [place], [place] - [place]  Phone: [phone] Fax: [phone]

## 2017-08-04 ENCOUNTER — Other Ambulatory Visit (HOSPITAL_BASED_OUTPATIENT_CLINIC_OR_DEPARTMENT_OTHER): Payer: Self-pay | Admitting: Internal Medicine

## 2017-08-04 DIAGNOSIS — I10 Essential (primary) hypertension: Secondary | ICD-10-CM

## 2017-08-28 ENCOUNTER — Ambulatory Visit (HOSPITAL_BASED_OUTPATIENT_CLINIC_OR_DEPARTMENT_OTHER): Payer: BLUE CROSS/BLUE SHIELD | Admitting: Internal Medicine

## 2017-12-17 ENCOUNTER — Other Ambulatory Visit (HOSPITAL_BASED_OUTPATIENT_CLINIC_OR_DEPARTMENT_OTHER): Payer: Self-pay | Admitting: Medical

## 2017-12-17 DIAGNOSIS — J209 Acute bronchitis, unspecified: Secondary | ICD-10-CM

## 2017-12-18 NOTE — Progress Notes (Signed)
PER Pharmacy, Deborah Mathis is a 62 year old female has requested a refill of Benzonatate 100 mg.      Last Office Visit: 06/23/17 with Luiz Iron, E  Last Physical Exam: 06/23/17      Other Med Adult:  Most Recent BP Reading(s)  05/20/17 : 136/74        Cholesterol (mg/dL)   Date Value   05/22/2016 201     LOW DENSITY LIPOPROTEIN DIRECT (mg/dL)   Date Value   05/22/2016 139     HIGH DENSITY LIPOPROTEIN (mg/dL)   Date Value   05/22/2016 40     TRIGLYCERIDES (mg/dL)   Date Value   01/28/2017 233 (H)         No results found for: TSHSC      No results found for: TSH    HEMOGLOBIN A1C (%)   Date Value   05/22/2016 5.3       No results found for: POCA1C      No results found for: INR    SODIUM (mmol/L)   Date Value   01/28/2017 143       POTASSIUM (mmol/L)   Date Value   01/28/2017 3.4 (L)           CREATININE (mg/dL)   Date Value   01/28/2017 0.6       Documented patient preferred pharmacies:    [id number] [place], [place] - [place]  Phone: [phone] Fax: [phone]

## 2018-04-10 ENCOUNTER — Encounter (HOSPITAL_BASED_OUTPATIENT_CLINIC_OR_DEPARTMENT_OTHER): Payer: Self-pay | Admitting: Internal Medicine

## 2018-04-10 ENCOUNTER — Ambulatory Visit (HOSPITAL_BASED_OUTPATIENT_CLINIC_OR_DEPARTMENT_OTHER): Payer: BLUE CROSS/BLUE SHIELD | Admitting: Internal Medicine

## 2018-04-10 VITALS — BP 142/78 | HR 86 | Temp 97.6°F | Ht 60.63 in | Wt 192.0 lb

## 2018-04-10 DIAGNOSIS — L918 Other hypertrophic disorders of the skin: Secondary | ICD-10-CM | POA: Diagnosis not present

## 2018-04-10 DIAGNOSIS — J209 Acute bronchitis, unspecified: Secondary | ICD-10-CM

## 2018-04-10 DIAGNOSIS — Z1231 Encounter for screening mammogram for malignant neoplasm of breast: Secondary | ICD-10-CM | POA: Diagnosis not present

## 2018-04-10 DIAGNOSIS — M19042 Primary osteoarthritis, left hand: Secondary | ICD-10-CM

## 2018-04-10 DIAGNOSIS — M19041 Primary osteoarthritis, right hand: Secondary | ICD-10-CM | POA: Diagnosis not present

## 2018-04-10 DIAGNOSIS — R030 Elevated blood-pressure reading, without diagnosis of hypertension: Secondary | ICD-10-CM

## 2018-04-10 DIAGNOSIS — R600 Localized edema: Secondary | ICD-10-CM | POA: Diagnosis not present

## 2018-04-10 DIAGNOSIS — Z1239 Encounter for other screening for malignant neoplasm of breast: Secondary | ICD-10-CM

## 2018-04-10 DIAGNOSIS — M25572 Pain in left ankle and joints of left foot: Secondary | ICD-10-CM | POA: Diagnosis not present

## 2018-04-10 DIAGNOSIS — M25511 Pain in right shoulder: Secondary | ICD-10-CM

## 2018-04-10 MED ORDER — ALBUTEROL SULFATE HFA 108 (90 BASE) MCG/ACT IN AERS
2.0000 | INHALATION_SPRAY | Freq: Four times a day (QID) | RESPIRATORY_TRACT | 2 refills | Status: DC | PRN
Start: 2018-04-10 — End: 2020-07-21

## 2018-04-10 MED ORDER — SPIRONOLACTONE-HCTZ 25-25 MG PO TABS: 1 | tablet | Freq: Every day | ORAL | 2 refills | 0 days | Status: DC

## 2018-04-10 MED ORDER — SPIRONOLACTONE-HCTZ 25-25 MG PO TABS
1.0000 | ORAL_TABLET | Freq: Every day | ORAL | 2 refills | Status: DC
Start: 2018-04-10 — End: 2018-06-29

## 2018-04-10 MED ORDER — ALBUTEROL SULFATE HFA 108 (90 BASE) MCG/ACT IN AERS: 2 | Inhaler | Freq: Four times a day (QID) | RESPIRATORY_TRACT | 2 refills | 0 days | Status: AC | PRN

## 2018-04-10 MED ORDER — NAPROXEN 500 MG PO TABS: 500 mg | tablet | Freq: Two times a day (BID) | ORAL | 0 refills | 0 days | Status: AC

## 2018-04-10 MED ORDER — NAPROXEN 500 MG PO TABS
500.0000 mg | ORAL_TABLET | Freq: Two times a day (BID) | ORAL | 0 refills | Status: DC
Start: 2018-04-10 — End: 2020-07-21

## 2018-04-10 NOTE — Progress Notes (Signed)
Deborah Mathis is a 62 year old female   Complains on right shoulder pain for a few weeks . Pian on the top of the shoulder and on the side. More pain with some movements .   Denies trauma .  Denies weakness of the arm   Takes either ibuprofen or naproxen for the hands pain and shoulder pain     Developed pain and swelling of the left ankle at  last week .   Twisted left ankle . Was swollen and tender.   At the end of the day both ankles are swollen , last week L>R  Takes HCTZ 25 mg as prescribed for BP and for swelling     Taking ibuprofen instead of naproxen mostly at night because of the hands pain .Sometimes take it at day time     Has some bumps on the neck for years . Recently one or another became inflamed with redness around . Requested a referral fo removal of them     July 17 leaving for 2 weeks in [place]     Requested a refill of albuterol inh especially for travel in case of cough .   Due for mammogram      BP (!) 155/85  Pulse 86  Temp 97.6 F (36.4 C) (Temporal)  Ht 5' 0.63" (1.54 m)  Wt 87.1 kg (192 lb)  LMP 04/05/2005  SpO2 98%  BMI 36.72 kg/m2   Appearance: NAD  Skin: no rash. A few skin tags on the neck . No sign of inflammation   Chest: good air entry b/l, no wheezing/rhonchi/crackles, no retractions/flaring  Heart: RRR, no M/R/G  Musculoskeletal: right shoulder : RROM as lifting it up above the shoulder line and internal rotation . Negative for erythema /swelling. Equal muscle tone and strengths of arms B/L  Left ankle : slight ly puffy on medial malleolus walks without limping but RROM as turning laterally .   Right ankle and foot without puffiness.   Assessment:  (M25.511) Acute pain of right shoulder  (primary encounter diagnosis)  Comment:  causing pt's discomfort  Plan: exercises discussed and handout given     (M25.572) Acute left ankle pain  Comment: due to minimal trauma   ACE bandage for support and immobilization .   Plan: exercises discussed and handout given      (R60.0) Edema of both feet  Comment: HCTZ 25 mg changed to Spironolactone - HCTZ 25-25 mg 1 tablet daily   Plan: spironolactone-hydrochlorothiazide         (ALDACTAZIDE) 25-25 MG per tablet  Feet elevation             (R03.0) Elevated blood pressure reading  Comment: decided to change HCTZ 25 mg to Spironolactone - HCTZ 25-25  Plan: fu in 2-3 weeks for BP check     (M19.041,  M19.042) Arthritis of both hands  Comment: chronic problem   Refill   Plan: naproxen (NAPROSYN) 500 MG tablet            (J20.9) Acute bronchitis, unspecified organism  Comment: occasionally   Refill   Plan: albuterol (PROAIR HFA) 108 (90 Base) MCG/ACT         inhaler            (Z12.31) Screening for malignant neoplasm of breast  Comment: HM  Plan: DIGITAL SCREEN MAM WCAD&TOM            (L91.8) Skin tag  Comment: fu removal   Plan: REFERRAL TO SKIN PROCEDURE  CLINIC ( INT)          Fu in 2-3 weeks for BP check or otherwise prn

## 2018-04-10 NOTE — Patient Instructions (Addendum)
Patient Education   Index Spanish     Adult Advisor 2017.1 published by [employer].  Last reviewed: 2015-03-30     Patient Education   Index English     Adult Advisor 2017.1 published by [employer].  Last reviewed: 2015-03-22

## 2018-04-21 ENCOUNTER — Encounter (HOSPITAL_BASED_OUTPATIENT_CLINIC_OR_DEPARTMENT_OTHER): Payer: Self-pay | Admitting: Internal Medicine

## 2018-04-21 ENCOUNTER — Ambulatory Visit (HOSPITAL_BASED_OUTPATIENT_CLINIC_OR_DEPARTMENT_OTHER): Payer: Self-pay | Admitting: Internal Medicine

## 2018-04-21 ENCOUNTER — Ambulatory Visit (HOSPITAL_BASED_OUTPATIENT_CLINIC_OR_DEPARTMENT_OTHER): Payer: BC Managed Care – POS | Admitting: Internal Medicine

## 2018-04-21 VITALS — BP 112/48 | HR 63 | Temp 98.4°F | Ht 60.0 in | Wt 190.0 lb

## 2018-04-21 DIAGNOSIS — M25572 Pain in left ankle and joints of left foot: Secondary | ICD-10-CM | POA: Diagnosis not present

## 2018-04-21 DIAGNOSIS — R600 Localized edema: Secondary | ICD-10-CM | POA: Diagnosis not present

## 2018-04-21 DIAGNOSIS — I10 Essential (primary) hypertension: Secondary | ICD-10-CM | POA: Diagnosis not present

## 2018-04-21 MED ORDER — COMPRESSION STOCKINGS: 1 | each | Freq: Every day | 11 refills | 0 days | Status: AC

## 2018-04-21 MED ORDER — COMPRESSION STOCKINGS
1.00 | Freq: Every day | 11 refills | Status: AC
Start: 2018-04-21 — End: 2019-04-21

## 2018-04-21 NOTE — Progress Notes (Signed)
Deborah Mathis is a 62 year old female   Problem List        Unprioritized    Acute left ankle pain     Choice Kleinsasser, PA-C, 04/23/2018  Still has some light pain of the left ankle after sprain a few weeks ago . Wear ACE bandage wrapped around helped with swelling and pain and was giving better support   No ibuprofen intake          Edema of lower extremity     Gasper Sells, PA-C, 04/23/2018  Fu for feet/ankles edema , L>R  Looked for compression stockings but didn't buy it yet   Pt sit at work place for working hours with her feet down . Waking up with no swelling of the ankles /feet    Started taking HCTZ spironolactone 25-25 from 04/11/18  Helped           Essential hypertension - Primary     Mykai Wendorf, PA-C, 04/23/2018  Fu hypertension and edema of ankles B/L, L>R  Started taking combination of HCTZ and Spironolactone 25-25 changed on 04/10/18 from HCTZ 25 mg   Doing well. Denies side effects as dizziness or weakness   Willing to continue              Problem List Items Addressed This Visit        Unprioritized    Acute left ankle pain     Compression stocking, or ACE wrap   Exercises dicussed and picture given          Edema of lower extremity     Continue diuretic  Keep feet elevated may be at work by using some stool under her desk?  Compression stockings          Relevant Medications    Elastic Bandages & Supports (COMPRESSION STOCKINGS)    Essential hypertension - Primary     Continue combination of HCTZ - Spironolactone 25-25 mg   Fu in 2-3 months  Blood work would be considered              OBJECTIVE:  BP 112/48  Pulse 63  Temp 98.4 F (36.9 C) (Temperature probe)  Ht 5' (1.524 m)  Wt 86.2 kg (190 lb)  LMP 04/05/2005  SpO2 97%  BMI 37.11 kg/m2  Appearance: NAD, control fever, adequate hydration  Skin: no rash  Chest: good air entry b/l, no wheezing/rhonchi/crackles, no retractions/flaring  Heart: RRR, no M/R/G  Musculoskeletal: minimal puffiness of feet and ankles B/L. Left ankle positive   form minimal tenderness and puffiness on medial malleolus   FROM   Assessment:  (I10) Essential hypertension  (primary encounter diagnosis)  Comment: controled by meds   Plan: fu in 2-3 months for further management   Blood lab will be considered     (R60.0) Edema of lower extremity  Comment: improving   Emphasized compression stockings benefits   Plan: Elastic Bandages & Supports (COMPRESSION         STOCKINGS)            (M25.572) Acute left ankle pain  Comment: residual tenderness after sprain    Plan: fu prn     fu in 2-3 months for further management of hypertension and other problems   Otherwise fu prn

## 2018-04-21 NOTE — Patient Instructions (Signed)
Patient Education   Index Spanish     Adult Advisor 2017.1 published by [employer].  Last reviewed: 2015-03-22

## 2018-04-23 ENCOUNTER — Encounter (HOSPITAL_BASED_OUTPATIENT_CLINIC_OR_DEPARTMENT_OTHER): Payer: Self-pay | Admitting: Internal Medicine

## 2018-04-23 DIAGNOSIS — M25572 Pain in left ankle and joints of left foot: Secondary | ICD-10-CM | POA: Insufficient documentation

## 2018-04-23 NOTE — Assessment & Plan Note (Signed)
Continue combination of HCTZ - Spironolactone 25-25 mg   Fu in 2-3 months  Blood work would be considered

## 2018-04-23 NOTE — Assessment & Plan Note (Signed)
Compression stocking, or ACE wrap   Exercises dicussed and picture given

## 2018-04-23 NOTE — Assessment & Plan Note (Signed)
Continue diuretic  Keep feet elevated may be at work by using some stool under her desk?  Compression stockings

## 2018-04-29 ENCOUNTER — Ambulatory Visit (HOSPITAL_BASED_OUTPATIENT_CLINIC_OR_DEPARTMENT_OTHER): Payer: Self-pay | Admitting: Internal Medicine

## 2018-04-29 DIAGNOSIS — Z1231 Encounter for screening mammogram for malignant neoplasm of breast: Secondary | ICD-10-CM | POA: Diagnosis not present

## 2018-04-29 DIAGNOSIS — Z1239 Encounter for other screening for malignant neoplasm of breast: Secondary | ICD-10-CM

## 2018-04-29 NOTE — Addendum Note (Signed)
Addended by: Baker Janus on: 04/29/2018 04:22 PM     Modules accepted: Orders

## 2018-04-30 LAB — MA SCREENING MAMMO BILATERAL DIGITAL WITH DBT & CAD

## 2018-05-08 ENCOUNTER — Other Ambulatory Visit (HOSPITAL_BASED_OUTPATIENT_CLINIC_OR_DEPARTMENT_OTHER): Payer: Self-pay | Admitting: Internal Medicine

## 2018-05-08 NOTE — Progress Notes (Unsigned)
PER Pharmacy, Deborah Mathis is a 62 year old female has requested a refill of omeprzole.      Last Office Visit: 04/21/18 with aronzon,e  Last Physical Exam: 06/23/17    There are no preventive care reminders to display for this patient.    Other Med Adult:  Most Recent BP [place](s)  04/21/18 : 112/48        Cholesterol (mg/dL)   Date Value   05/22/2016 201     LOW DENSITY LIPOPROTEIN DIRECT (mg/dL)   Date Value   05/22/2016 139     HIGH DENSITY LIPOPROTEIN (mg/dL)   Date Value   05/22/2016 40     TRIGLYCERIDES (mg/dL)   Date Value   01/28/2017 233 (H)         No results found for: TSHSC      No results found for: TSH    HEMOGLOBIN A1C (%)   Date Value   05/22/2016 5.3       No results found for: POCA1C      No results found for: INR    SODIUM (mmol/L)   Date Value   01/28/2017 143       POTASSIUM (mmol/L)   Date Value   01/28/2017 3.4 (L)           CREATININE (mg/dL)   Date Value   01/28/2017 0.6       Documented patient preferred pharmacies:    [id number] [place], [place]  Phone: [phone] Fax: [phone]

## 2018-06-24 ENCOUNTER — Ambulatory Visit (HOSPITAL_BASED_OUTPATIENT_CLINIC_OR_DEPARTMENT_OTHER): Payer: BC Managed Care – POS | Admitting: Internal Medicine

## 2018-06-28 ENCOUNTER — Other Ambulatory Visit (HOSPITAL_BASED_OUTPATIENT_CLINIC_OR_DEPARTMENT_OTHER): Payer: Self-pay | Admitting: Internal Medicine

## 2018-06-28 DIAGNOSIS — R600 Localized edema: Secondary | ICD-10-CM

## 2018-06-29 MED ORDER — SPIRONOLACTONE-HCTZ 25-25 MG PO TABS
1.0000 | ORAL_TABLET | Freq: Every day | ORAL | 2 refills | Status: DC
Start: 2018-06-29 — End: 2018-09-30

## 2018-06-29 MED ORDER — SPIRONOLACTONE-HCTZ 25-25 MG PO TABS: 1 | tablet | Freq: Every day | ORAL | 2 refills | 0 days | Status: AC

## 2018-06-29 NOTE — Progress Notes (Signed)
PER Pharmacy, Deborah Mathis is a 62 year old female has requested a refill of hydrochlorothiazide 25 (historical)      Last Office Visit: 04-21-18 with elvira aronzon  Last Physical Exam: 06-23-17    There are no preventive care reminders to display for this patient.    HTN Med:    Most Recent BP [place](s)  04/21/18 : 112/48  04/10/18 : (!) 142/78  05/20/17 : 136/74      Documented patient preferred pharmacies:    [id number] [place], [place]  Phone: [phone] Fax: [phone]

## 2018-07-08 ENCOUNTER — Ambulatory Visit: Payer: BC Managed Care – POS | Attending: Family Medicine | Admitting: Physician Assistant

## 2018-07-08 ENCOUNTER — Encounter (HOSPITAL_BASED_OUTPATIENT_CLINIC_OR_DEPARTMENT_OTHER): Payer: Self-pay | Admitting: Physician Assistant

## 2018-07-08 ENCOUNTER — Ambulatory Visit (HOSPITAL_BASED_OUTPATIENT_CLINIC_OR_DEPARTMENT_OTHER): Payer: BC Managed Care – POS | Admitting: Internal Medicine

## 2018-07-08 VITALS — BP 120/62 | HR 58 | Temp 96.7°F | Ht 61.22 in | Wt 183.0 lb

## 2018-07-08 DIAGNOSIS — Z0000 Encounter for general adult medical examination without abnormal findings: Secondary | ICD-10-CM | POA: Diagnosis present

## 2018-07-08 DIAGNOSIS — E785 Hyperlipidemia, unspecified: Secondary | ICD-10-CM | POA: Insufficient documentation

## 2018-07-08 LAB — VITAMIN D,25 HYDROXY: VITAMIN D,25 HYDROXY: 24 ng/mL — ABNORMAL LOW (ref 30.0–100.0)

## 2018-07-08 LAB — LIPID PANEL
Cholesterol: 239 mg/dL (ref 0–239)
HIGH DENSITY LIPOPROTEIN: 40 mg/dL (ref 40–?)
LOW DENSITY LIPOPROTEIN DIRECT: 164 mg/dL (ref 0–189)
TRIGLYCERIDES: 184 mg/dL — ABNORMAL HIGH (ref 0–150)

## 2018-07-08 MED ORDER — ZOSTER VAC RECOMB ADJUVANTED 50 MCG/0.5ML IM SUSR
0.5000 mL | INTRAMUSCULAR | 0 refills | Status: AC
Start: 2018-07-08 — End: 2019-07-03

## 2018-07-08 MED ORDER — ZOSTER VAC RECOMB ADJUVANTED 50 MCG/0.5ML IM SUSR: 1 mL | each | INTRAMUSCULAR | 0 refills | 0 days | Status: AC

## 2018-07-08 NOTE — Progress Notes (Signed)
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 States previously "always hungry." Lately appetite more normal. Eats lots of vegetables, some fruit.  Exercise: No formal exercise  Domestic Violence: Feels safe at home  Menstruation: Post-menopausal x7 years  Pap: No history of abn pap. Pap UTD    History reviewed. No pertinent past medical history.    History reviewed. No pertinent surgical history.    Social History     Socioeconomic History    Marital status: Married     Spouse name: Not on file    Number of children: Not on file    Years of education: Not on file    Highest education level: Not on file   Occupational History    Not on file   Social Needs    Financial resource strain: Not on file    Food insecurity:     Worry: Not on file     Inability: Not on file    Transportation needs:     Medical: Not on file     Non-medical: Not on file   Tobacco Use    Smoking status: Never Smoker    Smokeless tobacco: Never Used   Substance and Sexual Activity    Alcohol use: No     Comment: 1 drin every 1-2 montrhs    Drug use: No    Sexual activity: Yes     Partners: Male   Lifestyle    Physical activity:     Days per week: Not on file     Minutes per session: Not on file    Stress: Not on file   Relationships    Social connections:     Talks on phone: Not on file     Gets together: Not on file     Attends religious service: Not on file     Active member of club or organization: Not on file     Attends meetings of clubs or organizations: Not on file     Relationship status: Not on file    Intimate partner violence:     Fear of current or ex partner: Not on file     Emotionally abused: Not on file      Physically abused: Not on file     Forced sexual activity: Not on file   Other Topics Concern    Military Service No    Blood Transfusions Not Asked    Caffeine Concern Not Asked    Occupational Exposure No     Comment: repetitive motion hands    Hobby Hazards Not Asked    Sleep Concern Yes    Stress Concern No    Weight Concern Yes    Special Diet No     Comment: many vegetables    Back Care Not Asked    Exercise No     Comment: walking 90 min, while unemployed    Bike Helmet Not Asked    Seat Belt Yes    Self-Exams Not Asked   Social History Narrative    07/08/2018:    At [employer] x5 years.    Lives with husband. One son (71 y/o) who lives at home    Feels  safe at home            Born: Born [place], came to [place] age 65    Family: lives with husband and son (b. 1991)    Education: has GED    Social: 3 bros and 1 sister living in [place] (one brother dec.d/t CAD)    Occupation:assembly line work    Leisure:    2012: just completed 20 years at her job        2013:    Lost job x 4 months; now working at [employer].        11/14/2014    NOw working [employer].    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 Has to cook for family     Walks in lunch time and brake about every day . For 20-25 min     + sit belt     Using sun block lotion sometimes     Works : Multimedia programmer , Agricultural consultant. Likes it. Works full time     Leasure: taking care about her 45 y/o neighbor , share food that cooks for family . Runs the house . Goes to sister, seeing little nieces                            Review of patient's family history indicates:  Problem: Heart      Relation: Brother          Age of Onset: (Not Specified)           Comment: deceased  Problem: Heart      Relation: Brother          Age of Onset: (Not Specified)  Problem: Heart      Relation: Father          Age of Onset: (Not Specified)          Comment: deceased  Problem: Heart      Relation: Mother          Age of Onset: (Not Specified)          Comment: HTN, MI  Problem: Cancer - Prostate      Relation: Brother          Age of Onset: (Not Specified)  Problem: Diabetes      Relation: FamHxNeg          Age of Onset: (Not Specified)      OBJECTIVE:  BP 120/62  Pulse 58  Temp 96.7 F (35.9 C) (Temporal)  Ht 5' 1.22" (1.555 m)  Wt 83 kg (183 lb)  LMP 04/05/2005  SpO2 99%  BMI 34.33 kg/m2  Appearance: Alert and oriented in NAD, non-antalgic gait, and transitions easily from exam table.  Skin: Warm, dry, and without significant lesions or rashes.   HEENT: Head is normocephalic, atraumatic. EOMi bilaterally, no scleral icterus or conjunctival injection. External ear canals clear with normal TMs  bilaterally. Nares patent. OP pink, moist, with no exudates or tonsillar adenopathy.   Neck: No cervical lymphadenopathy noted. Thyroid symmetrical without enlargement or masses noted.   Cardiac: RRR, no M/R/G.   Respiratory: CTA bilaterally, no W/R/R.   Abdomen: Normal BS x 4, soft, non-tender, no masses palpated. No hepatosplenomegaly.   Neuro: Normal sensation upper and lower extremities. No gross neurologic deficits noted.  MS: Normal cervical, lumber, upper and lower extremity movements and strength. DTR 2+ b/l lower extremities  Vasc: Non-pitting edema b/l lower legs. Pedal pulses 2+ b/l.  Psych: Alert and Oriented. Normal Affect      ASSESSMENT AND PLAN:    Problem List Items Addressed This Visit     None      Visit Diagnoses     Routine general medical examination at a health care facility    -  Primary    Relevant Medications    Zoster Vac Recomb Adjuvanted 50 MCG/0.5ML SUSR    Other Relevant Orders    VITAMIN D,25 HYDROXY    HEMOGLOBIN A1C    LIPID PANEL          Health behavior  goals: More physical activity  Immunizations: Gave paper Rx for Zoster  Reproductive health: Pap UTD, pt declines STI testing  Routine screening examinations:   - Blood pressure: normal today    - HIV: has had negative test. No need for retesting.    - Gonorrhea / chlamydia: declines, has had neg in the past. No new partners   - Cardiovascular risk: will screen for diabetes and assess lipids   - Dyslipidemia: check lipids   - PAP smear: UTD   - Mammogram: UTD    - Colon cancer screening: UTD    We discussed ALL MEDICATIONS and the importance of medication compliance. The patient was ready to learn and no apparent learning barriers were identified. I explained the diagnosis and treatment plan, and the patient expressed understanding of the content. Possible side effects of the prescribed medication(s) were explained.  I attempted to answer any questions regarding the diagnosis and the proposed treatment.    I have reviewed the past medical, surgical, social and family history and updated these sections of EpicCare as relevant. All interim labs, test results, and consult notes were reviewed and discussed with Deborah Mathis. Medications were reconciled during this visit and a current medication list was given to the patient at the end of the visit.    Joaquin Bend, PA-C

## 2018-07-09 ENCOUNTER — Telehealth (HOSPITAL_BASED_OUTPATIENT_CLINIC_OR_DEPARTMENT_OTHER): Payer: Self-pay | Admitting: Ambulatory Care

## 2018-07-09 ENCOUNTER — Other Ambulatory Visit (HOSPITAL_BASED_OUTPATIENT_CLINIC_OR_DEPARTMENT_OTHER): Payer: Self-pay | Admitting: Physician Assistant

## 2018-07-09 DIAGNOSIS — E559 Vitamin D deficiency, unspecified: Secondary | ICD-10-CM

## 2018-07-09 LAB — HEMOGLOBIN A1C
ESTIMATED AVERAGE GLUCOSE: 105 (ref 74–160)
HEMOGLOBIN A1C: 5.3 % (ref 4.0–5.6)

## 2018-07-09 MED ORDER — VITAMIN D 50 MCG (2000 UT) PO TABS
1.0000 | ORAL_TABLET | Freq: Every day | ORAL | 11 refills | Status: DC
Start: 2018-07-09 — End: 2019-09-06

## 2018-07-09 MED ORDER — VITAMIN D 2000 UNITS PO TABS: 1 | tablet | Freq: Every day | ORAL | 11 refills | 0 days | Status: AC

## 2018-07-09 NOTE — Progress Notes (Signed)
Dear RN,    Please:    1. Create Telephone encounter for this patient.  2. Share with the patient the attached results     Plan:  1. A prescription for daily Vit D supplement will be written. .    2. Type of Outreach: 1 phone call and if unable to reach send letter    3. Document the conversation in the Telephone Encounter and close the encounter, no need to send back to me.     Thank you,  Joaquin Bend, PA-C

## 2018-07-09 NOTE — Progress Notes (Signed)
Message   Received: Today   Message Contents   Joaquin Bend, PA-C  P [facility] Rn Triage            Dear RN,     Please:    1. Create Telephone encounter for this patient.   2. Share with the patient the attached results     Plan:   1. A prescription for daily Vit D supplement will be written. .     2. Type of Outreach: 1 phone call and if unable to reach send letter     3. Document the conversation in the Telephone Encounter and close the encounter, no need to send back to me.     Thank you,   Joaquin Bend, PA-C    07/08/2018 6:16 PM - Interface, Lab     Component Value Flag Ref Range Units Status   VITAMIN D,25 HYDROXY 24  Low   30.0 - 100.0 ng/mL Final     Spoke with pt an notified her that her Vit D level was low and [facility] [place] pt she is to take Vit D 2000 unit tablet once daily  Rx has refills  Pt understands and agrees

## 2018-09-19 ENCOUNTER — Other Ambulatory Visit (HOSPITAL_BASED_OUTPATIENT_CLINIC_OR_DEPARTMENT_OTHER): Payer: Self-pay | Admitting: Internal Medicine

## 2018-09-19 NOTE — Progress Notes (Signed)
PER Pharmacy, Deborah Mathis is a 62 year old female has requested a refill of omeprazole 20.      Last Office Visit: 07-08-18 with Joaquin Bend  Last Physical Exam: 07-08-18    There are no preventive care reminders to display for this patient.    Other Med Adult:  Most Recent BP Reading(s)  07/08/18 : 120/62        Cholesterol (mg/dL)   Date Value   07/08/2018 239     LOW DENSITY LIPOPROTEIN DIRECT (mg/dL)   Date Value   07/08/2018 164     HIGH DENSITY LIPOPROTEIN (mg/dL)   Date Value   07/08/2018 40     TRIGLYCERIDES (mg/dL)   Date Value   07/08/2018 184 (H)         No results found for: TSHSC      No results found for: TSH    HEMOGLOBIN A1C (%)   Date Value   07/08/2018 5.3       No results found for: POCA1C      No results found for: INR    SODIUM (mmol/L)   Date Value   01/28/2017 143       POTASSIUM (mmol/L)   Date Value   01/28/2017 3.4 (L)           CREATININE (mg/dL)   Date Value   01/28/2017 0.6       Documented patient preferred pharmacies:    [id number] [place], [place]  Phone: [phone] Fax: [phone]

## 2018-09-30 ENCOUNTER — Other Ambulatory Visit (HOSPITAL_BASED_OUTPATIENT_CLINIC_OR_DEPARTMENT_OTHER): Payer: Self-pay | Admitting: Internal Medicine

## 2018-09-30 DIAGNOSIS — R600 Localized edema: Secondary | ICD-10-CM

## 2018-09-30 NOTE — Progress Notes (Signed)
PER Pharmacy, Deborah Mathis is a 62 year old female has requested a refill of spironolactone-hctz 25-25 tablet.      Last Office Visit: 07/08/2018 with Joaquin Bend  Last Physical Exam: 07/08/2018      Other Med Adult:  Most Recent BP Reading(s)  07/08/18 : 120/62        Cholesterol (mg/dL)   Date Value   07/08/2018 239     LOW DENSITY LIPOPROTEIN DIRECT (mg/dL)   Date Value   07/08/2018 164     HIGH DENSITY LIPOPROTEIN (mg/dL)   Date Value   07/08/2018 40     TRIGLYCERIDES (mg/dL)   Date Value   07/08/2018 184 (H)         No results found for: TSHSC      No results found for: TSH    HEMOGLOBIN A1C (%)   Date Value   07/08/2018 5.3       No results found for: POCA1C      No results found for: INR    SODIUM (mmol/L)   Date Value   01/28/2017 143       POTASSIUM (mmol/L)   Date Value   01/28/2017 3.4 (L)           CREATININE (mg/dL)   Date Value   01/28/2017 0.6       Documented patient preferred pharmacies:    [id number] [place], [place]  Phone: [phone] Fax: [phone]

## 2019-01-22 ENCOUNTER — Other Ambulatory Visit (HOSPITAL_BASED_OUTPATIENT_CLINIC_OR_DEPARTMENT_OTHER): Payer: Self-pay | Admitting: Internal Medicine

## 2019-01-22 DIAGNOSIS — R600 Localized edema: Secondary | ICD-10-CM

## 2019-01-22 NOTE — Progress Notes (Signed)
PER Pharmacy, Deborah Mathis is a 63 year old female has requested a refill of spirolactone.  Last Office Visit: 07/08/2018 with Joaquin Bend  Last Physical Exam: 07/08/2018  There are no preventive care reminders to display for this patient.     Other Med Adult:  Most Recent BP Reading(s)  07/08/18 : 120/62        Cholesterol (mg/dL)   Date Value   07/08/2018 239     LOW DENSITY LIPOPROTEIN DIRECT (mg/dL)   Date Value   07/08/2018 164     HIGH DENSITY LIPOPROTEIN (mg/dL)   Date Value   07/08/2018 40     TRIGLYCERIDES (mg/dL)   Date Value   07/08/2018 184 (H)         No results found for: TSHSC      No results found for: TSH    HEMOGLOBIN A1C (%)   Date Value   07/08/2018 5.3       No results found for: POCA1C      No results found for: INR    SODIUM (mmol/L)   Date Value   01/28/2017 143       POTASSIUM (mmol/L)   Date Value   01/28/2017 3.4 (L)           CREATININE (mg/dL)   Date Value   01/28/2017 0.6        Documented patient preferred pharmacies:    [id number] [place], [place]  Phone: [phone] Fax: [phone]

## 2019-07-19 ENCOUNTER — Other Ambulatory Visit (HOSPITAL_BASED_OUTPATIENT_CLINIC_OR_DEPARTMENT_OTHER): Payer: Self-pay | Admitting: Internal Medicine

## 2019-07-19 DIAGNOSIS — R600 Localized edema: Secondary | ICD-10-CM

## 2019-07-19 NOTE — Progress Notes (Signed)
PER Pharmacy, Deborah Mathis is a 63 year old female has requested a refill of spironalactone .      Last Office Visit:07/08/2018  with Raynald Blend   Last Physical Exam: 090/02/2018    There are no preventive care reminders to display for this patient.    Other Med Adult:  Most Recent BP Reading(s)  07/08/18 : 120/62        Cholesterol (mg/dL)   Date Value   07/08/2018 239     LOW DENSITY LIPOPROTEIN DIRECT (mg/dL)   Date Value   07/08/2018 164     HIGH DENSITY LIPOPROTEIN (mg/dL)   Date Value   07/08/2018 40     TRIGLYCERIDES (mg/dL)   Date Value   07/08/2018 184 (H)         No results found for: TSHSC      No results found for: TSH    HEMOGLOBIN A1C (%)   Date Value   07/08/2018 5.3       No results found for: POCA1C      No results found for: INR    SODIUM (mmol/L)   Date Value   01/28/2017 143       POTASSIUM (mmol/L)   Date Value   01/28/2017 3.4 (L)           CREATININE (mg/dL)   Date Value   01/28/2017 0.6       Documented patient preferred pharmacies:    [id number] [place], [place]  Phone: [phone] Fax: [phone]

## 2019-09-06 ENCOUNTER — Other Ambulatory Visit (HOSPITAL_BASED_OUTPATIENT_CLINIC_OR_DEPARTMENT_OTHER): Payer: Self-pay | Admitting: Physician Assistant

## 2019-09-06 DIAGNOSIS — E559 Vitamin D deficiency, unspecified: Secondary | ICD-10-CM

## 2019-09-06 NOTE — Telephone Encounter (Signed)
PER Pharmacy, Deborah Mathis is a 63 year old female has requested a refill of VITIAMIN.      Last Office Visit: 07/08/2018 with Lutricia Feil, L  Last Physical Exam: 07/08/2018    There are no preventive care reminders to display for this patient.    Other Med Adult:  Most Recent BP Reading(s)  07/08/18 : 120/62        Cholesterol (mg/dL)   Date Value   07/08/2018 239     LOW DENSITY LIPOPROTEIN DIRECT (mg/dL)   Date Value   07/08/2018 164     HIGH DENSITY LIPOPROTEIN (mg/dL)   Date Value   07/08/2018 40     TRIGLYCERIDES (mg/dL)   Date Value   07/08/2018 184 (H)         No results found for: TSHSC      No results found for: TSH    HEMOGLOBIN A1C (%)   Date Value   07/08/2018 5.3       No results found for: POCA1C      No results found for: INR    SODIUM (mmol/L)   Date Value   01/28/2017 143       POTASSIUM (mmol/L)   Date Value   01/28/2017 3.4 (L)           CREATININE (mg/dL)   Date Value   01/28/2017 0.6       Documented patient preferred pharmacies:    [id number] [place], [place]  Phone: [phone] Fax: [phone]

## 2019-12-18 ENCOUNTER — Other Ambulatory Visit (HOSPITAL_BASED_OUTPATIENT_CLINIC_OR_DEPARTMENT_OTHER): Payer: Self-pay | Admitting: Internal Medicine

## 2019-12-18 NOTE — Telephone Encounter (Signed)
PER Pharmacy, Deborah Mathis is a 64 year old female has requested a refill of omeprazole.    Last Office Visit: 07/08/2018 with Nedra Hai  Last Physical Exam: 07/08/2018    There are no preventive care reminders to display for this patient.    ADHD Med: Most Recent BP Reading(s)  07/08/18 : 120/62   Most Recent Weight Reading(s)  07/08/18 : 83 kg (183 lb)      Documented patient preferred pharmacies:    [id number] [place], [place] - [place]  Phone: [phone] Fax: [phone]

## 2020-01-26 ENCOUNTER — Other Ambulatory Visit: Payer: Self-pay

## 2020-01-28 ENCOUNTER — Other Ambulatory Visit: Payer: Self-pay

## 2020-01-28 ENCOUNTER — Ambulatory Visit: Payer: BC Managed Care – POS | Attending: Internal Medicine

## 2020-01-28 DIAGNOSIS — Z23 Encounter for immunization: Secondary | ICD-10-CM | POA: Diagnosis not present

## 2020-02-18 ENCOUNTER — Other Ambulatory Visit: Payer: Self-pay

## 2020-02-18 ENCOUNTER — Ambulatory Visit: Payer: BC Managed Care – POS | Attending: Internal Medicine

## 2020-02-18 DIAGNOSIS — Z23 Encounter for immunization: Secondary | ICD-10-CM | POA: Diagnosis present

## 2020-03-08 ENCOUNTER — Other Ambulatory Visit (HOSPITAL_BASED_OUTPATIENT_CLINIC_OR_DEPARTMENT_OTHER): Payer: Self-pay | Admitting: Internal Medicine

## 2020-03-08 DIAGNOSIS — E559 Vitamin D deficiency, unspecified: Secondary | ICD-10-CM

## 2020-03-08 NOTE — Telephone Encounter (Signed)
PER Pharmacy, Deborah Mathis is a 64 year old female has requested a refill of vitamin D.      Last Office Visit: 07/08/2018 with Orpah Melter  Last Physical Exam: 07/08/2018    PAP SMEAR due on 02/28/2020  HPV SCREENING due on 02/28/2020    Other Med Adult:  Most Recent BP Reading(s)  07/08/18 : 120/62        Cholesterol (mg/dL)   Date Value   07/08/2018 239     LOW DENSITY LIPOPROTEIN DIRECT (mg/dL)   Date Value   07/08/2018 164     HIGH DENSITY LIPOPROTEIN (mg/dL)   Date Value   07/08/2018 40     TRIGLYCERIDES (mg/dL)   Date Value   07/08/2018 184 (H)         No results found for: TSHSC      No results found for: TSH    HEMOGLOBIN A1C (%)   Date Value   07/08/2018 5.3       No results found for: POCA1C      No results found for: INR    SODIUM (mmol/L)   Date Value   01/28/2017 143       POTASSIUM (mmol/L)   Date Value   01/28/2017 3.4 (L)           CREATININE (mg/dL)   Date Value   01/28/2017 0.6       Documented patient preferred pharmacies:    [id number] [place], [place] - [place]  Phone: [phone] Fax: [phone]

## 2020-03-14 NOTE — Telephone Encounter (Signed)
Spoke to pt to schedule f/u per pt preference

## 2020-03-22 ENCOUNTER — Ambulatory Visit (HOSPITAL_BASED_OUTPATIENT_CLINIC_OR_DEPARTMENT_OTHER): Payer: Self-pay | Admitting: Physician Assistant

## 2020-06-05 ENCOUNTER — Other Ambulatory Visit (HOSPITAL_BASED_OUTPATIENT_CLINIC_OR_DEPARTMENT_OTHER): Payer: Self-pay | Admitting: Internal Medicine

## 2020-06-05 DIAGNOSIS — E559 Vitamin D deficiency, unspecified: Secondary | ICD-10-CM

## 2020-06-06 NOTE — Telephone Encounter (Signed)
PER Pharmacy, Deborah Mathis is a 64 year old female has requested a refill of Vitamin D.      Last Office Visit: 07-08-2018 with Joaquin Bend  Last Physical Exam: 07-08-2018    PAP SMEAR due on 02/28/2020  HPV SCREENING due on 02/28/2020    Other Med Adult:  Most Recent BP Reading(s)  07/08/18 : 120/62        Cholesterol (mg/dL)   Date Value   07/08/2018 239     LOW DENSITY LIPOPROTEIN DIRECT (mg/dL)   Date Value   07/08/2018 164     HIGH DENSITY LIPOPROTEIN (mg/dL)   Date Value   07/08/2018 40     TRIGLYCERIDES (mg/dL)   Date Value   07/08/2018 184 (H)         No results found for: TSHSC      No results found for: TSH    HEMOGLOBIN A1C (%)   Date Value   07/08/2018 5.3       No results found for: POCA1C      No results found for: INR    SODIUM (mmol/L)   Date Value   01/28/2017 143       POTASSIUM (mmol/L)   Date Value   01/28/2017 3.4 (L)           CREATININE (mg/dL)   Date Value   01/28/2017 0.6       Documented patient preferred pharmacies:    [id number] [place], [place]  Phone: [phone] Fax: [phone]

## 2020-07-20 ENCOUNTER — Other Ambulatory Visit (HOSPITAL_BASED_OUTPATIENT_CLINIC_OR_DEPARTMENT_OTHER): Payer: Self-pay | Admitting: Internal Medicine

## 2020-07-20 DIAGNOSIS — R600 Localized edema: Secondary | ICD-10-CM

## 2020-07-20 NOTE — Telephone Encounter (Signed)
PER Pharmacy, Deborah Mathis is a 64 year old female has requested a refill of spironolactone. hctz.      Last Office Visit: 07/08/2018 with Orpah Melter  Last Physical Exam: 07/08/2018    PAP SMEAR due on 02/28/2020  HPV SCREENING due on 02/28/2020  COLONOSCOPY due on 10/12/2020    Other Med Adult:  Most Recent BP Reading(s)  07/08/18 : 120/62        Cholesterol (mg/dL)   Date Value   07/08/2018 239     LOW DENSITY LIPOPROTEIN DIRECT (mg/dL)   Date Value   07/08/2018 164     HIGH DENSITY LIPOPROTEIN (mg/dL)   Date Value   07/08/2018 40     TRIGLYCERIDES (mg/dL)   Date Value   07/08/2018 184 (H)         No results found for: TSHSC      No results found for: TSH    HEMOGLOBIN A1C (%)   Date Value   07/08/2018 5.3       No results found for: POCA1C      No results found for: INR    SODIUM (mmol/L)   Date Value   01/28/2017 143       POTASSIUM (mmol/L)   Date Value   01/28/2017 3.4 (L)           CREATININE (mg/dL)   Date Value   01/28/2017 0.6       Documented patient preferred pharmacies:    [id number] [place], [place] - [place]  Phone: [phone] Fax: [phone]

## 2020-07-21 ENCOUNTER — Encounter (HOSPITAL_BASED_OUTPATIENT_CLINIC_OR_DEPARTMENT_OTHER): Payer: Self-pay | Admitting: Internal Medicine

## 2020-07-21 ENCOUNTER — Telehealth (HOSPITAL_BASED_OUTPATIENT_CLINIC_OR_DEPARTMENT_OTHER): Payer: Self-pay

## 2020-07-21 ENCOUNTER — Ambulatory Visit: Payer: BC Managed Care – POS | Attending: Internal Medicine | Admitting: Internal Medicine

## 2020-07-21 DIAGNOSIS — D500 Iron deficiency anemia secondary to blood loss (chronic): Secondary | ICD-10-CM | POA: Diagnosis not present

## 2020-07-21 DIAGNOSIS — E559 Vitamin D deficiency, unspecified: Secondary | ICD-10-CM | POA: Insufficient documentation

## 2020-07-21 DIAGNOSIS — I10 Essential (primary) hypertension: Secondary | ICD-10-CM | POA: Diagnosis not present

## 2020-07-21 DIAGNOSIS — E782 Mixed hyperlipidemia: Secondary | ICD-10-CM | POA: Insufficient documentation

## 2020-07-21 DIAGNOSIS — R600 Localized edema: Secondary | ICD-10-CM | POA: Insufficient documentation

## 2020-07-21 DIAGNOSIS — Z131 Encounter for screening for diabetes mellitus: Secondary | ICD-10-CM | POA: Diagnosis not present

## 2020-07-21 DIAGNOSIS — J4520 Mild intermittent asthma, uncomplicated: Secondary | ICD-10-CM

## 2020-07-21 DIAGNOSIS — J45909 Unspecified asthma, uncomplicated: Secondary | ICD-10-CM | POA: Insufficient documentation

## 2020-07-21 MED ORDER — CHOLECALCIFEROL 50 MCG (2000 UT) PO TABS
2000.0000 [IU] | ORAL_TABLET | Freq: Every day | ORAL | 3 refills | Status: DC
Start: 2020-07-21 — End: 2021-12-18

## 2020-07-21 MED ORDER — SPIRONOLACTONE-HCTZ 25-25 MG PO TABS
1.0000 | ORAL_TABLET | Freq: Every day | ORAL | 3 refills | Status: DC
Start: 2020-07-21 — End: 2020-07-31

## 2020-07-21 MED ORDER — ALBUTEROL SULFATE HFA 108 (90 BASE) MCG/ACT IN AERS
2.0000 | INHALATION_SPRAY | Freq: Four times a day (QID) | RESPIRATORY_TRACT | 5 refills | Status: DC | PRN
Start: 2020-07-21 — End: 2021-03-20

## 2020-07-21 NOTE — Progress Notes (Signed)
Deborah Mathis is a 64 year old female who is running out of medication.   She has an in person visit scheduled with me on 07/31/20.      She has not checked her own BP.  She thinks her weight is pretty stable.  Maybe gained a few pounds.    She wants to schedule her pap with a female provider.  She will get a Shingrix from [place] at her in person visit and will get the flu shot at work.    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  Carpal tunnel sx are pretty good.  Ankle pain resolved.      Patient denies any exertional chest pain, dyspnea, palpitations, syncope, orthopnea, or paroxysmal nocturnal dyspnea.    (I10) Essential hypertension  (primary encounter diagnosis)  Comment: keep your in person appointment with me  I will check blood pressure and get labs that are pre ordered today.  Renew Rx  Plan: spironolactone-hydrochlorothiazide         (ALDACTAZIDE) 25-25 MG per tablet, BASIC         METABOLIC PANEL             (R60.0) Edema of both feet  Comment: will examine feet and check labs at in person visit.  Renew diuretic.  Plan: spironolactone-hydrochlorothiazide         (ALDACTAZIDE) 25-25 MG per tablet, BASIC         METABOLIC PANEL             (E55.9) Vitamin D deficiency  Comment: renew Rx and pre order lab for in person visit  Plan: cholecalciferol (D-2000 MAXIMUM STRENGTH) 2000         UNIT TABS tablet, VITAMIN D,25 HYDROXY             (J45.20) Mild intermittent reactive airway disease without complication  Comment: She is doing fine now, but needs to keep a fresh inhaler on hand in case of an exacerbation.  They usually occur seasonally  Plan: albuterol HFA (PROAIR HFA) 108 (90 Base)         MCG/ACT inhaler             (D50.0) Iron deficiency anemia due to chronic blood loss  Comment: she had blood loss due to fibroids.  Not bleeding now.    Plan: will get CBC and iron studies at [place] visit    (E78.2) Hyperlipidemia, mixed  Comment: we have not checked in two years.  Last LDL was over 160 and TG was elevated.  Plan:  LIPID PANEL             (Z13.1) Screening for diabetes mellitus  Comment: last A1C in 2019 was normal.  Pre order labs to recheck at in person visit  Plan: HEMOGLOBIN A1C           She will get flu shot at work.    We can start the Shingrix and pneumococcal series at her in person visit.    Current Outpatient Medications   Medication Sig    spironolactone-hydrochlorothiazide (ALDACTAZIDE) 25-25 MG per tablet Take 1 tablet by mouth daily Please make an appointment with your PCP team prior to refills    cholecalciferol (D-2000 MAXIMUM STRENGTH) 2000 UNIT TABS tablet Take 1 tablet by mouth in the morning    albuterol HFA (PROAIR HFA) 108 (90 Base) MCG/ACT inhaler Inhale 2 puffs into the lungs 4 (four) times daily as needed for Wheezing (cough) Use with upper resp infection  omeprazole (PRILOSEC) 20 MG capsule TAKE 1 CAPSULE BY MOUTH EVERY DAY AS NEEDED     No current facility-administered medications for this visit.

## 2020-07-21 NOTE — Telephone Encounter (Signed)
30 day Rx sent. Pt not seen in >1 year. Needs in person or televisit f/u w/ team before Rx runs out.    Camie Patience, PA-C, 07/21/2020    8:57 AM

## 2020-07-21 NOTE — Telephone Encounter (Signed)
called to schedule  pap with Ricci Barker  unable to reach left message

## 2020-07-31 ENCOUNTER — Other Ambulatory Visit: Payer: Self-pay

## 2020-07-31 ENCOUNTER — Ambulatory Visit: Payer: BC Managed Care – POS | Attending: Internal Medicine | Admitting: Internal Medicine

## 2020-07-31 ENCOUNTER — Encounter (HOSPITAL_BASED_OUTPATIENT_CLINIC_OR_DEPARTMENT_OTHER): Payer: Self-pay | Admitting: Internal Medicine

## 2020-07-31 VITALS — BP 108/64 | HR 83 | Temp 96.5°F | Wt 188.0 lb

## 2020-07-31 DIAGNOSIS — I10 Essential (primary) hypertension: Secondary | ICD-10-CM

## 2020-07-31 DIAGNOSIS — Z6835 Body mass index (BMI) 35.0-35.9, adult: Secondary | ICD-10-CM

## 2020-07-31 DIAGNOSIS — R600 Localized edema: Secondary | ICD-10-CM | POA: Diagnosis present

## 2020-07-31 DIAGNOSIS — E782 Mixed hyperlipidemia: Secondary | ICD-10-CM | POA: Diagnosis present

## 2020-07-31 DIAGNOSIS — Z131 Encounter for screening for diabetes mellitus: Secondary | ICD-10-CM

## 2020-07-31 DIAGNOSIS — Z23 Encounter for immunization: Secondary | ICD-10-CM | POA: Insufficient documentation

## 2020-07-31 DIAGNOSIS — E559 Vitamin D deficiency, unspecified: Secondary | ICD-10-CM | POA: Insufficient documentation

## 2020-07-31 DIAGNOSIS — Z6837 Body mass index (BMI) 37.0-37.9, adult: Secondary | ICD-10-CM | POA: Insufficient documentation

## 2020-07-31 DIAGNOSIS — Z1231 Encounter for screening mammogram for malignant neoplasm of breast: Secondary | ICD-10-CM | POA: Diagnosis present

## 2020-07-31 DIAGNOSIS — D500 Iron deficiency anemia secondary to blood loss (chronic): Secondary | ICD-10-CM

## 2020-07-31 LAB — FERRITIN: FERRITIN: 109 ng/mL (ref 8–252)

## 2020-07-31 LAB — CBC, PLATELET & DIFFERENTIAL
ABSOLUTE BASO COUNT: 0.1 10*3/uL (ref 0.0–0.1)
ABSOLUTE EOSINOPHIL COUNT: 0.2 10*3/uL (ref 0.0–0.8)
ABSOLUTE IMM GRAN COUNT: 0.02 10*3/uL (ref 0.00–0.03)
ABSOLUTE LYMPH COUNT: 1.7 10*3/uL (ref 0.6–5.9)
ABSOLUTE MONO COUNT: 0.5 10*3/uL (ref 0.2–1.4)
ABSOLUTE NEUTROPHIL COUNT: 4.8 10*3/uL (ref 1.6–8.3)
ABSOLUTE NRBC COUNT: 0 10*3/uL (ref 0.0–0.0)
BASOPHIL %: 0.7 % (ref 0.0–1.2)
EOSINOPHIL %: 2.3 % (ref 0.0–7.0)
HEMATOCRIT: 41.8 % (ref 34.1–44.9)
HEMOGLOBIN: 13.5 g/dL (ref 11.2–15.7)
IMMATURE GRANULOCYTE %: 0.3 % (ref 0.0–0.4)
LYMPHOCYTE %: 24 % (ref 15.0–54.0)
MEAN CORP HGB CONC: 32.3 g/dL (ref 31.0–37.0)
MEAN CORPUSCULAR HGB: 27.1 pg (ref 26.0–34.0)
MEAN CORPUSCULAR VOL: 83.9 fl (ref 80.0–100.0)
MEAN PLATELET VOLUME: 12.8 fL — ABNORMAL HIGH (ref 8.7–12.5)
MONOCYTE %: 7 % (ref 4.0–13.0)
NEUTROPHIL %: 65.7 % (ref 40.0–75.0)
NRBC %: 0 % (ref 0.0–0.0)
PLATELET COUNT: 202 10*3/uL (ref 150–400)
RBC DISTRIBUTION WIDTH STD DEV: 39.5 fL (ref 35.1–46.3)
RED BLOOD CELL COUNT: 4.98 M/uL (ref 3.90–5.20)
WHITE BLOOD CELL COUNT: 7.3 10*3/uL (ref 4.0–11.0)

## 2020-07-31 LAB — IRON: IRON: 70 ug/dL (ref 50–170)

## 2020-07-31 LAB — LIPID PANEL
Cholesterol: 250 mg/dL (ref 0–239)
HIGH DENSITY LIPOPROTEIN: 51 mg/dL (ref 40–?)
LOW DENSITY LIPOPROTEIN DIRECT: 173 mg/dL (ref 0–189)
TRIGLYCERIDES: 151 mg/dL — ABNORMAL HIGH (ref 0–150)

## 2020-07-31 LAB — BASIC METABOLIC PANEL
ANION GAP: 8 mmol/L (ref 5–15)
BUN (UREA NITROGEN): 25 mg/dL — ABNORMAL HIGH (ref 7–18)
CALCIUM: 9.4 mg/dL (ref 8.5–10.1)
CARBON DIOXIDE: 27 mmol/L (ref 21–32)
CHLORIDE: 103 mmol/L (ref 98–107)
CREATININE: 0.8 mg/dL (ref 0.4–1.2)
ESTIMATED GLOMERULAR FILT RATE: >60 mL/min (ref >60)
Glucose Random: 87 mg/dL (ref 74–160)
POTASSIUM: 3.8 mmol/L (ref 3.5–5.1)
SODIUM: 138 mmol/L (ref 136–145)

## 2020-07-31 LAB — TOTAL IRON BINDING CAPACITY: TOTAL IRON BIND CAPACITY CALC: 491 ug/dL (ref 280–504)

## 2020-07-31 LAB — RETICULOCYTE COUNT
ABS RETICULOCYTE COUNT: 0.09 M/uL (ref 0.02–0.13)
IMMATURE RETIC FRACTION %: 10.5 % (ref 2.3–15.9)
RETIC HEMOGLOBIN EQUIVALENT: 32.1 pg (ref 28.2–35.7)
RETICULOCYTE COUNT %: 1.8 % (ref 0.5–2.5)

## 2020-07-31 LAB — VITAMIN D,25 HYDROXY: VITAMIN D,25 HYDROXY: 28 ng/mL — ABNORMAL LOW (ref 30.0–100.0)

## 2020-07-31 MED ORDER — SPIRONOLACTONE-HCTZ 25-25 MG PO TABS
1.0000 | ORAL_TABLET | Freq: Every day | ORAL | 3 refills | Status: DC
Start: 2020-07-31 — End: 2021-12-18

## 2020-07-31 NOTE — Progress Notes (Signed)
Deborah Mathis is a 64 year old female who is here today to review the following issues:  Essential hypertension  (primary encounter diagnosis)  Breast cancer screening by mammogram  Edema of both feet  BMI 35.0-35.9,adult  Need for zoster vaccination  Need for prophylactic vaccination and inoculation against influenza  Iron deficiency anemia due to chronic blood loss  Hyperlipidemia, mixed  Vitamin D deficiency  Screening for diabetes mellitus    She is using a spironolactone, HCTZ combo pill and BP is controlled on Rx.  She has 1-2+ ankle edema that is stable without skin breakdown.  Contributing to this finding is her weight.  Current BMI is over 35.      Most Recent Weight Reading(s)  07/31/20 : 85.3 kg (188 lb)  07/08/18 : 83 kg (183 lb)  04/21/18 : 86.2 kg (190 lb)  04/10/18 : 87.1 kg (192 lb)  05/20/17 : 86.6 kg (191 lb)    And obesity is complicated by HTN, hyperlipidemia, and edema.     We scheduled a mammogram.  We caught her up on flu shot and Shingrix #  1.  She will come back in two months for the second Shingrix and a pneumonia vaccine.    She is also going to book a pap and HPV with Ricci Barker before the end of the year.    We are going to check labs today to monitor iron deficiency anemia, and to screen for diabetes.    BP 108/64    Pulse 83    Temp 96.5 F (35.8 C) (Temporal)    Wt 85.3 kg (188 lb)    LMP 04/05/2005    SpO2 97%    BMI 35.27 kg/m   2+ ankle edema    (I10) Essential hypertension  (primary encounter diagnosis)  Comment: renew Rx and check BMP  Plan: spironolactone-hydrochlorothiazide         (ALDACTAZIDE) 25-25 MG per tablet, BASIC         METABOLIC PANEL             (Z12.31) Breast cancer screening by mammogram  Comment: mammogram scheduled  Plan: [place] SCREENING MAMMO BILATERAL DIGITAL WITH DBT &        CAD             (R60.0) Edema of both feet  Comment: renew diuretic and check BMP  Plan: spironolactone-hydrochlorothiazide         (ALDACTAZIDE) 25-25 MG per tablet, BASIC         METABOLIC  PANEL             (Z68.35) BMI 35.0-35.9,adult  Comment: discussed diet, walking and weight loss     (Z23) Need for zoster vaccination  Comment: dose #1 given today  Plan: IMMUNIZATION ADMIN EACH ADD, HZV ZOSTER VACC         RECOMBINANT ADJUVANTED IM NJX             (Z23) Need for prophylactic vaccination and inoculation against influenza  Comment: gioven  Plan: IMMUNIZATION ADMIN SINGLE, IIV4 VACC PRESERV         FREE AGE 54 MONTHS AND OLDER, 0.5ML, IM             (D50.0) Iron deficiency anemia due to chronic blood loss  Comment: check labs today  Plan: TOTAL IRON BINDING CAPACITY, IRON, FERRITIN,         RETICULOCYTE COUNT, CBC, PLATELET &         DIFFERENTIAL             (  E78.2) Hyperlipidemia, mixed  Comment: monitor with lipids  Plan: LIPID PANEL             (E55.9) Vitamin D deficiency  Comment: check lab today  Plan: VITAMIN D,25 HYDROXY             (Z13.1) Screening for diabetes mellitus  Comment: screen with A1C  Plan: HEMOGLOBIN A1C             Current Outpatient Medications   Medication Sig    spironolactone-hydrochlorothiazide (ALDACTAZIDE) 25-25 MG per tablet Take 1 tablet by mouth daily Please make an appointment with your PCP team prior to refills    cholecalciferol (D-2000 MAXIMUM STRENGTH) 2000 UNIT TABS tablet Take 1 tablet by mouth in the morning    albuterol HFA (PROAIR HFA) 108 (90 Base) MCG/ACT inhaler Inhale 2 puffs into the lungs 4 (four) times daily as needed for Wheezing (cough) Use with upper resp infection    omeprazole (PRILOSEC) 20 MG capsule TAKE 1 CAPSULE BY MOUTH EVERY DAY AS NEEDED     No current facility-administered medications for this visit.

## 2020-07-31 NOTE — Progress Notes (Signed)
07/31/2020  VIS given prior to administration and reviewed with the patient and or legal guardian. Patient understands the disease and the vaccine. See immunization/Injection module or chart review for date of publication and additional information.  Randel Pigg, LPN

## 2020-08-01 LAB — HEMOGLOBIN A1C
ESTIMATED AVERAGE GLUCOSE: 105 mg/dL (ref 74–160)
HEMOGLOBIN A1C: 5.3 % (ref 4.0–5.6)

## 2020-08-17 ENCOUNTER — Ambulatory Visit
Admission: RE | Admit: 2020-08-17 | Discharge: 2020-08-17 | Disposition: A | Discharge status: Home / Self Care | Payer: BC Managed Care – POS | Attending: Internal Medicine | Admitting: Internal Medicine

## 2020-08-17 ENCOUNTER — Encounter (HOSPITAL_BASED_OUTPATIENT_CLINIC_OR_DEPARTMENT_OTHER): Payer: Self-pay

## 2020-08-17 ENCOUNTER — Other Ambulatory Visit: Payer: Self-pay

## 2020-08-17 DIAGNOSIS — Z1231 Encounter for screening mammogram for malignant neoplasm of breast: Secondary | ICD-10-CM

## 2020-08-17 HISTORY — DX: Other specified health status: Z78.9

## 2020-08-23 ENCOUNTER — Encounter (HOSPITAL_BASED_OUTPATIENT_CLINIC_OR_DEPARTMENT_OTHER): Payer: Self-pay | Admitting: Internal Medicine

## 2020-08-28 ENCOUNTER — Encounter (HOSPITAL_BASED_OUTPATIENT_CLINIC_OR_DEPARTMENT_OTHER): Payer: Self-pay

## 2020-08-28 ENCOUNTER — Telehealth (HOSPITAL_BASED_OUTPATIENT_CLINIC_OR_DEPARTMENT_OTHER): Payer: Self-pay

## 2020-08-28 NOTE — Telephone Encounter (Signed)
Pt needs to recall which vaccines were taken on 9/27 visit.   Requesting shingles vaccine  Call back to assist with clinical health maintenance.

## 2020-08-28 NOTE — Telephone Encounter (Signed)
The patient is calling today to clarify which vaccines she received at the office visit on 07/31/2020. She was told she was getting 3 vaccines but only got 2 and does not know which ones she was given  Pt states her job is offering the Flu vaccine so if she did not get it at the office, she will get it at work    Reviewed the chart, the patient received her Flu vaccine and first Shingrix #1 vaccine on 07/31/2020  She did not receive a pneumococcal vaccine at this visit  Pt is aware she will need a second dose of the Shingrix vaccine 2-6 months from the first dose  Pt has an appt on 10/04/2020 and will get her dose then  She can also receive her Pneumococcal vaccine then as well    Pt asking about her lab results from 07/31/2020  mychart msg sent on 08/23/2020. Read this msg with her lab results to the patient  Pt voiced her understanding to the above and with no further questions

## 2020-09-05 ENCOUNTER — Ambulatory Visit (HOSPITAL_BASED_OUTPATIENT_CLINIC_OR_DEPARTMENT_OTHER): Payer: Self-pay | Admitting: Internal Medicine

## 2020-10-04 ENCOUNTER — Other Ambulatory Visit: Payer: Self-pay

## 2020-10-04 ENCOUNTER — Encounter (HOSPITAL_BASED_OUTPATIENT_CLINIC_OR_DEPARTMENT_OTHER): Payer: Self-pay | Admitting: Internal Medicine

## 2020-10-04 ENCOUNTER — Ambulatory Visit: Payer: BC Managed Care – POS | Attending: Internal Medicine | Admitting: Internal Medicine

## 2020-10-04 VITALS — BP 128/80 | HR 73 | Ht 59.84 in | Wt 188.0 lb

## 2020-10-04 DIAGNOSIS — Z23 Encounter for immunization: Secondary | ICD-10-CM | POA: Diagnosis present

## 2020-10-04 DIAGNOSIS — Z124 Encounter for screening for malignant neoplasm of cervix: Secondary | ICD-10-CM | POA: Insufficient documentation

## 2020-10-04 NOTE — Progress Notes (Signed)
Deborah Mathis is a 64 year old female  Overdue for PAP. Last pap done on 02/2015.   Denies any GYN problem   Denies urine incontinence   OBJECTIVE:  BP 128/80    Pulse 73    Ht 4' 11.84" (1.52 m)    Wt 85.3 kg (188 lb)    LMP 04/05/2005    SpO2 98%    BMI 36.91 kg/m   Appearance: NAD  Skin: no rash  Genitalia: Pelvic: External genitalia and vagina atrophic . Bimanual and vaginal exam normal  Assessment:  (Z12.4) Cervical cancer screening  (primary encounter diagnosis)  Comment: HM  Plan: CYTOPATH, C/V, THIN LAYER, OBTAINING SCREEN PAP        SMEAR, HUMAN PAPILLOMAVIRUS (HPV)            (Z23) Need for zoster vaccination  Comment: HM  Plan: IMMUNIZATION ADMIN SINGLE, HZV ZOSTER VACC         RECOMBINANT ADJUVANTED IM NJX          Celene Pippins, PA-C

## 2020-10-04 NOTE — Progress Notes (Signed)
10/04/2020  VIS given prior to administration and reviewed with the patient and or legal guardian. Patient understands the disease and the vaccine. See immunization/Injection module or chart review for date of publication and additional information.  Randel Pigg, LPN

## 2020-10-05 LAB — HUMAN PAPILLOMAVIRUS (HPV): HUMAN PAPILLOMAVIRUS: NEGATIVE

## 2020-10-09 LAB — CYTOPATH, C/V, THIN LAYER

## 2020-10-11 ENCOUNTER — Encounter (HOSPITAL_BASED_OUTPATIENT_CLINIC_OR_DEPARTMENT_OTHER): Payer: Self-pay | Admitting: Internal Medicine

## 2020-11-01 ENCOUNTER — Encounter (HOSPITAL_BASED_OUTPATIENT_CLINIC_OR_DEPARTMENT_OTHER): Payer: Self-pay | Admitting: Internal Medicine

## 2020-11-02 ENCOUNTER — Ambulatory Visit (HOSPITAL_BASED_OUTPATIENT_CLINIC_OR_DEPARTMENT_OTHER): Payer: BC Managed Care – POS

## 2020-12-09 ENCOUNTER — Other Ambulatory Visit (HOSPITAL_BASED_OUTPATIENT_CLINIC_OR_DEPARTMENT_OTHER): Payer: Self-pay | Admitting: Internal Medicine

## 2020-12-09 NOTE — Telephone Encounter (Signed)
PER Pharmacy, Deborah Mathis is a 65 year old female has requested a refill of omeprazole .      Last Office Visit: 10/04/20 with Ricci Barker aronzon  Last Physical Exam: 07/08/2018      Other Med Adult:  Most Recent BP Reading(s)  10/04/20 : 128/80        Cholesterol (mg/dL)   Date Value   07/31/2020 250 (*H)     LOW DENSITY LIPOPROTEIN DIRECT (mg/dL)   Date Value   07/31/2020 173     HIGH DENSITY LIPOPROTEIN (mg/dL)   Date Value   07/31/2020 51     TRIGLYCERIDES (mg/dL)   Date Value   07/31/2020 151 (H)         No results found for: TSHSC      No results found for: TSH    HEMOGLOBIN A1C (%)   Date Value   07/31/2020 5.3       No results found for: POCA1C      No results found for: INR    SODIUM (mmol/L)   Date Value   07/31/2020 138       POTASSIUM (mmol/L)   Date Value   07/31/2020 3.8           CREATININE (mg/dL)   Date Value   07/31/2020 0.8       Documented patient preferred pharmacies:    [id number] - [place], [place] [facility] [place] [address]  Phone: [phone] Fax: [phone]

## 2021-03-20 ENCOUNTER — Encounter (HOSPITAL_BASED_OUTPATIENT_CLINIC_OR_DEPARTMENT_OTHER): Payer: Self-pay

## 2021-03-20 ENCOUNTER — Ambulatory Visit (HOSPITAL_BASED_OUTPATIENT_CLINIC_OR_DEPARTMENT_OTHER): Payer: Self-pay | Admitting: Registered Nurse

## 2021-03-20 ENCOUNTER — Emergency Department
Admission: AD | Admit: 2021-03-20 | Discharge: 2021-03-20 | Disposition: A | Discharge status: Home / Self Care | Payer: BC Managed Care – POS | Source: Ambulatory Visit | Attending: Emergency Medicine | Admitting: Emergency Medicine

## 2021-03-20 DIAGNOSIS — J9801 Acute bronchospasm: Secondary | ICD-10-CM | POA: Diagnosis not present

## 2021-03-20 MED ORDER — ALBUTEROL SULFATE HFA 108 (90 BASE) MCG/ACT IN AERS
2.0000 | INHALATION_SPRAY | RESPIRATORY_TRACT | 0 refills | Status: DC
Start: 2021-03-20 — End: 2021-04-23

## 2021-03-20 MED ORDER — BENZONATATE 100 MG PO CAPS
100.0000 mg | ORAL_CAPSULE | Freq: Three times a day (TID) | ORAL | 0 refills | Status: DC | PRN
Start: 2021-03-20 — End: 2021-04-18

## 2021-03-20 NOTE — Telephone Encounter (Signed)
Regarding: Coughing   ----- Message from Marina Goodell sent at 03/20/2021  8:48 AM EDT -----  Last  Saturday coughing getting worse, having trouble.

## 2021-03-20 NOTE — Telephone Encounter (Signed)
COVIDTRIAGE -- INITIAL TRIAGE:    I have confirmed the patient's name and DOB. Yes     Emergency care:    1. Is the patient patient gasping for air or unable to speak? No  2. Does the patient have new severe chest pain? No  3. Is the patient lethargic or does the patient have altered mental status?  No    A) COVID symptoms (criteria for testing):    The patient has the following NEW symptoms (past 14 days): Shortness of breath/dyspnea , cough h/a body aches and pain   Was at work yesterday . Started coughing . SOB . Co-workers were looking at patient " they thought I had corona "  Takes Albuterol inhaler for her asthma . Last time she  Took it was last Summer due to cough . Has ran out of inhaler   Had 2 negative Covid tests recently , PCR at work and home test  Received 2 Covid vaccines and one booster   Patient requests refill of inhaler   Missed work today   Cough is ongoing day and night . Slept poorly last night     Current day of symptoms:   If dyspnea, duration of dyspnea: 3 days     If fever, duration of fever: unsure . Has been taking ibuprofen for headache and body aches and pain     Other symptoms: as above     C) Need for in-person evaluation:    1. Is the patient experiencing respiratory symptoms (dypsnea, orthopnea, dyspnea on exertion, wheezing), worsening respiratory symptoms from baseline, or worsening of a chronic cough? Yes   2. Has the patient had a fever for > 48 hours? No  3. Is the patient at [place] ? 4 with worsening symptoms? No  4. Does the patient have chest pain (note: new severe chest pain must be referred to ED)? No  5. Is the patient dizzy or lightheaded? No  6. Does the patient have severe sore throat? No  7. Is the patient pregnant? No  8. Is the patient requesting in-person evaluation?  No    Disposition:    1. I have provided the patient with Home Care Advice (use COVID[facility]OMECARE). Yes   2. I have ordered testing (for symptomatic patients not requiring in-person evaluation, and  asymptomatic contacts). N/A.  Patients requiring testing only should be scheduled at [facility]. They should be scheduled at [facility] only if unable to get to [facility].  3. I have given the patient directions to Drive-Thru Testing (use DRIVETHRUDIRECTIONS) or [facility] (ACCDIRECTIONS), if appropriate. N/A    Final disposition: needs in person appt . No openings at [facility] today . Advised evaluation at [facility] today . Patient agrees     COVID HOME CARE ADVICE:    The following instructions are intended for patients who have been have Covid, have a Covid test pending, or who may have Covid but cannot be tested.    Treating symptoms and anticipatory guidance:     Continue to take your prescribed medicines.   Do not take other medicines unless they have been prescribed.   Rest and take plenty of fluids.   For symptomatic relief, use acetaminophen or ibuprofen, as long as you have not been told you cannot take these medicines.   Patients with worsening symptoms should:  ? Call their home clinic phone number.  ? Call 911 in an emergency.    Self-isolation and self-quarantine precautions:  Self-isolation directions for patients:  ? You should not leave home -- no work, school, public places, stores, public transportation. No exceptions except emergencies.  ? If your household members do not have Covid and you are able to, stay in a separate room from them and mask if in a shared space.  ? Duration of self-isolation:   ? Stay home for AT LEAST 5 days since first day of symptoms or date of positive test. If you have no symptoms or your symptoms are improved on or after Day 5, you can leave your house. You should mask around others for at least 10 days.  ? If you were hospitalized for Covid or are immunosuppressed, we recommend that you stay home for at least 10 days.  ? If you are still awaiting a test and your test comes back negative, you can end isolation if you feel better.   Self-quarantine  directions for household members and close contacts:  ? Patients should notify all close contacts that they have symptoms of Covid.  ? All close contacts should be tested.  ? Close contacts should quarantine for 5 days and mask around others for another 5 days if they are unvaccinated or are eligible for a booster and have not received one. Close contacts who are vaccinated/boosted should wear a mask around others for 10 days but do not need to quarantine.  ? If the person who was being tested tests negative, contacts can end quarantine.

## 2021-03-20 NOTE — Discharge Instructions (Signed)
Be sure to take allergy medicine every day like Claritin or Zyrtec.  You can also use the inhaler prescribed and the Tessalon Perle pills to help with your cough.    If you are not improving in 2-3 days, please check in with your regular doctor to see if you need other testing or treatment.    Go to an EMERGENCY ROOM right away for: High fevers, trouble breathing, or for any other concerns.

## 2021-03-20 NOTE — UC Triage Notes (Signed)
Patient presents with cough with associated nasal congestion.  Onset 3 days.

## 2021-03-20 NOTE — Narrator Note (Signed)
Patient Disposition  Patient education for diagnosis, medications, activity, diet and follow-up.  Patient left UC 11:11 AM.  Patient rep received written instructions.    Interpreter to provide instructions: No    Patient belongings with patient: YES    Have all existing LDAs been addressed? N/A    Have all IV infusions been stopped? N/A    Destination: Home instructions reviewed.

## 2021-03-23 NOTE — UC Provider Notes (Signed)
I have reviewed the UC nursing notes. I have reviewed the patient's past medical history/problem list, allergies, social history and medication list.  I saw this patient primarily.    HPI:  This is a 65 year old patient presenting with cough and nasal congestion for the past 3 days, ran out of her albuterol inhaler, wondering if she needs another.  COVID tests have been negative.  Sometimes coughs so much she feels short of breath.  Has body aches, not sure if she had a fever.  Tol PO    ROS: Pertinent positives were reviewed as per the HPI above.     Past Medical History/Problem List:  Past Medical History:  No date: No known problems  Patient Active Problem List:     Carpal tunnel syndrome     Nonspecific reaction to tuberculin skin test without active tuberculosis     Iron deficiency anemia     Uterine fibroid     Hydradenitis     Seasonal allergies     Vitamin D deficiency     Arthritis of both hands     Essential hypertension     Edema of lower extremity     Acute left ankle pain     Dyslipidemia     Reactive airway disease without complication     BMI 02.7-25.3,GUYQI      Past Surgical History:  History reviewed. No pertinent surgical history.    Medications:   No current facility-administered medications for this encounter.     Current Outpatient Medications   Medication Sig    albuterol HFA (PROAIR HFA) 108 (90 Base) MCG/ACT inhaler Inhale 2 puffs into the lungs See Admin Instructions Using spacer, inhale 1-2 puffs every 6 hours for 3 days, then as needed.  for 14 days    benzonatate (TESSALON PERLES) 100 MG capsule Take 1-2 capsules by mouth 3 (three) times daily as needed for Cough  for up to 3 days    omeprazole (PRILOSEC) 20 MG capsule TAKE 1 CAPSULE BY MOUTH EVERY DAY AS NEEDED    spironolactone-hydrochlorothiazide (ALDACTAZIDE) 25-25 MG per tablet Take 1 tablet by mouth daily Please make an appointment with your PCP team prior to refills    cholecalciferol (D-2000 MAXIMUM STRENGTH) 2000 UNIT TABS  tablet Take 1 tablet by mouth in the morning    omeprazole (PRILOSEC) 20 MG capsule TAKE 1 CAPSULE BY MOUTH EVERY DAY AS NEEDED       Social History:  Social History    Tobacco Use      Smoking status: Never Smoker      Smokeless tobacco: Never Used    Alcohol use: No      Comment: 1 drin every 1-2 montrhs      Family History:   Review of patient's family history indicates:  Problem: Heart      Relation: Brother          Age of Onset: (Not Specified)          Comment: deceased  Problem: Heart      Relation: Brother          Age of Onset: (Not Specified)  Problem: Heart      Relation: Father          Age of Onset: (Not Specified)          Comment: deceased  Problem: Heart      Relation: Mother          Age of Onset: (Not Specified)  Comment: HTN, MI  Problem: Cancer - Prostate      Relation: Brother          Age of Onset: (Not Specified)  Problem: Diabetes      Relation: FamHxNeg          Age of Onset: (Not Specified)       Allergies:  Review of Patient's Allergies indicates:  No Known Allergies    Physical Exam:   03/20/21  1045   BP: 164/82   Pulse: 83   Resp: 18   Temp: 97.8 F   TempSrc: Temporal   SpO2: 97%       GEN: NAD, appears comfortable, tight cough  HEENT: Atraumatic, normocephalic.  PERRL, EOMI.  Conjunctiva clear.  Neck supple.  CARDIOVASC: Regular rate and rhythm, no m/r/g.  No peripheral edema.  RESP: No respiratory distress, good air exchange, breath sounds CTAB.  ABD: Soft, NT/ND. No HSM noted.  NEURO: Alert and oriented, CN III-XII grossly nl, MAEx4  PSYCH: Appropriate affect and thought  SKIN: Warm, dry, no rashes or bruising visible      UC Course and Medical Decision-making:    The patient is a 65 year old with a tight/wheezy cough for the past few days in the setting of being out of her albuterol inhaler.  No chest pain or trouble breathing when not coughing.  Has already had negative COVID testing.  We will give a prescription for new inhaler.  Also recommended trying allergy medicine  to see if that helps and Tessalon prescription given. discussed home care, follow up plan, and reasons to seek ED care.    Disposition:  Discharged    Condition: Stable    Diagnosis/Diagnoses:  Cough due to bronchospasm      Lendell Caprice, MD

## 2021-04-18 ENCOUNTER — Other Ambulatory Visit (HOSPITAL_BASED_OUTPATIENT_CLINIC_OR_DEPARTMENT_OTHER): Payer: Self-pay | Admitting: Internal Medicine

## 2021-04-18 NOTE — Telephone Encounter (Signed)
PER Pharmacy, Deborah Mathis is a 65 year old female has requested a refill of benzonatate.      Last Office Visit: 10/04/20 with Ricci Barker aronzon  Last Physical Exam: 07/08/2018      Other Med Adult:  Most Recent BP Reading(s)  03/20/21 : 164/82        Cholesterol (mg/dL)   Date Value   07/31/2020 250 (*H)     LOW DENSITY LIPOPROTEIN DIRECT (mg/dL)   Date Value   07/31/2020 173     HIGH DENSITY LIPOPROTEIN (mg/dL)   Date Value   07/31/2020 51     TRIGLYCERIDES (mg/dL)   Date Value   07/31/2020 151 (H)         No results found for: TSHSC      No results found for: TSH    HEMOGLOBIN A1C (%)   Date Value   07/31/2020 5.3       No results found for: POCA1C      No results found for: INR    SODIUM (mmol/L)   Date Value   07/31/2020 138       POTASSIUM (mmol/L)   Date Value   07/31/2020 3.8           CREATININE (mg/dL)   Date Value   07/31/2020 0.8       Documented patient preferred pharmacies:    [id number] [place] [place] [facility] [place]  Phone: [phone] Fax: [phone]

## 2021-04-19 ENCOUNTER — Telehealth (HOSPITAL_BASED_OUTPATIENT_CLINIC_OR_DEPARTMENT_OTHER): Payer: Self-pay | Admitting: Internal Medicine

## 2021-04-19 MED ORDER — BENZONATATE 100 MG PO CAPS
100.0000 mg | ORAL_CAPSULE | Freq: Three times a day (TID) | ORAL | 0 refills | Status: DC | PRN
Start: 2021-04-19 — End: 2021-04-23

## 2021-04-19 NOTE — Telephone Encounter (Addendum)
Call back to the patient  Informed her a RX for the Ladona Ridgel was sent to the pharmacy  The medication is to get the patient through the next few days until her appt on Monday as the provider agrees the pt needs an in person evaluation by a provider for the reoccurring cough  Appt Monday 04/23/21 at the [facility] 9:10AM for evaluation    Informed the patient of emergent sx that require going to the ED and she voices her understanding

## 2021-04-19 NOTE — Telephone Encounter (Signed)
Call to the patient regarding a refill request for Tessalon Perles  Pt was prescribed the medication on 03/20/21 at Urgent Care for cough due to bronchospasm    Pt states the cough in May has resolved  This is a new cough that started on Saturday   Cough is now a wet cough and is non productive  Cough is intermittent throughout the day and is interfering with sleep  Cough has stayed the same since Saturday and has not gotten worse or better  Denies SOB, DOE and cyanosis  Denies chest tightness and chest pain  + nasal congestion which is improving  Pt states she took a Rapid COVID test 3 days ago which was negative  She reports getting her 2 vaccines and a booster dose. COVID booster was administered at work and she will send a pic of the booster via mychart to update her record    No [facility] appt available until Monday 04/23/21  Pt declined going to Urgent Care and would like a RX sent for the Tessalon Perles at they were very effective at treating her cough last time  She is in agreement to be seen Monday at the [facility] for her cough but would like the medication to get her through the weekend    Will route to the provider regarding the RX for Yett[place], PA-C  P Cfh/N Rn  Caller: Unspecified (Yesterday, 3:55 PM)  Received rx request for tessalon pearles that were prescribed 1 month ago for acute cough. Hoping someone could check in w/ pt about why refill requested. Did symptoms ever go away or are they persisting? Or if they went away is she having a cough again?     -AK

## 2021-04-19 NOTE — Telephone Encounter (Signed)
Hello,    Patient calling requesting a call back or refill on her Benzonatate.    Patient states her Cough has returned.    Best contact number [phone]

## 2021-04-19 NOTE — Telephone Encounter (Signed)
See RX refill encounter on 04/18/21 for documentation    Will close this encounter as it is a duplicate encounter

## 2021-04-23 ENCOUNTER — Other Ambulatory Visit: Payer: Self-pay

## 2021-04-23 ENCOUNTER — Ambulatory Visit: Payer: BC Managed Care – POS | Attending: Family Medicine | Admitting: Medical

## 2021-04-23 VITALS — BP 124/77 | HR 66 | Temp 98.8°F | Wt 189.6 lb

## 2021-04-23 DIAGNOSIS — J069 Acute upper respiratory infection, unspecified: Secondary | ICD-10-CM | POA: Diagnosis present

## 2021-04-23 DIAGNOSIS — J4520 Mild intermittent asthma, uncomplicated: Secondary | ICD-10-CM | POA: Diagnosis present

## 2021-04-23 DIAGNOSIS — Z20822 Contact with and (suspected) exposure to covid-19: Secondary | ICD-10-CM | POA: Insufficient documentation

## 2021-04-23 MED ORDER — BENZONATATE 100 MG PO CAPS
100.0000 mg | ORAL_CAPSULE | Freq: Three times a day (TID) | ORAL | 0 refills | Status: DC | PRN
Start: 2021-04-23 — End: 2021-05-03

## 2021-04-23 MED ORDER — ALBUTEROL SULFATE HFA 108 (90 BASE) MCG/ACT IN AERS
2.0000 | INHALATION_SPRAY | RESPIRATORY_TRACT | 0 refills | Status: DC
Start: 2021-04-23 — End: 2022-10-30

## 2021-04-23 MED ORDER — FLUTICASONE PROPIONATE 50 MCG/ACT NA SUSP
1.0000 | Freq: Every day | NASAL | 0 refills | Status: DC
Start: 2021-04-23 — End: 2021-05-03

## 2021-04-23 MED ORDER — LORATADINE 10 MG PO TABS
10.0000 mg | ORAL_TABLET | Freq: Every day | ORAL | 0 refills | Status: DC
Start: 2021-04-23 — End: 2021-05-15

## 2021-04-23 NOTE — Progress Notes (Signed)
Acute Care Clinic Note    Subjective  Deborah Mathis 65 year old English-speaking female who presents for evaluation in acute care clinic.    HPI  Coronavirus Vaccine Status: Two doses of Pfizer/Moderna completed on 02/2020    #Cough  - 6/11 started coughing, fatigue  DOI 9   - home test was negative   - tessalon helped   - no fevers   - no n/v  - some loose stools yesterday   - used albuterol - helps during illnesses   - had itchy eyes   - no meds today  - no SOB      ROS  No abdominal pain, nausea, vomiting, diarrhea. No lower extremity swelling. No loss of smell. No new rashes or skin changes.     Medical risks for COVID complications:  HTN, BMI    Per chart review prior to visit:  Review of Patient's Allergies indicates:  No Known Allergies    Objective  BP 124/77    Pulse 66    Temp 98.8 F (37.1 C) (Temporal)    Wt 86 kg (189 lb 9.6 oz)    LMP 04/05/2005    SpO2 96%    BMI 37.22 kg/m      Most Recent O2 Sat Reading(s)  04/23/21 : 96%  03/20/21 : 97%  10/04/20 : 98%    Gen: no respiratory distress  HEENT: Nares: patent + clear rhinorrhea. OP moist, no tonsillar exudate, edema or erythema + postnasal drip. Tympanic membranes clear b/l. No LAD   CV: RRR no MRG  Pulm: no rhonchi, no wheezing, no bibasilar rales  Skin: good skin turgor, not diaphoretic  Neuro: alert and oriented to time, person and place  Psych: normal affect. Normal thought content, speech, mood are noted.    Assessment/Plan   1. Viral URI  2. Mild intermittent reactive airway disease without complication  65 y/o F with 9 d of cough and congestion. No sign of bacterial infection on exam. Likely viral URI. Discussed symptomatic care including tylenol, NSAIDs, nasal saline, salt water gargles. RTC with worsening symptoms or fever. Refilling albuterol, discussed prn use   - albuterol HFA (PROAIR HFA) 108 (90 Base) MCG/ACT inhaler; Inhale 2 puffs into the lungs See Admin Instructions Using spacer, inhale 1-2 puffs every 6 hours for 3 days,  then as needed.  for 14 days  Dispense: 1 each; Refill: 0  - COVID-19 OUTPATIENT  - loratadine (CLARITIN) 10 MG tablet; Take 1 tablet by mouth in the morning.  Dispense: 30 tablet; Refill: 0  - fluticasone (FLONASE) 50 MCG/ACT nasal spray; 1 spray by Each Nostril route in the morning.  Dispense: 1 each; Refill: 0  - benzonatate (TESSALON PERLES) 100 MG capsule; Take 1-2 capsules by mouth 3 (three) times daily as needed for Cough  for up to 10 days  Dispense: 30 capsule; Refill: 0  - Symptomatic treatment discussed   - Precautions given, including to call immediately or if new or worsening shortness of breath, and when to seek emergency care or call 911.    Disposition  Dispo to home: Home isolation instructions and symptom precaution printed in AVS and reviewed with patient.   Follow up in 1 day(s) via results on MyChart     I spent a total of 25 minutes on this visit on the date of service (total time includes all activities performed on the date of service)      Norton Blizzard, PA-C

## 2021-04-23 NOTE — Patient Instructions (Signed)
MARIACHAVES : Mychart username

## 2021-04-24 ENCOUNTER — Telehealth (HOSPITAL_BASED_OUTPATIENT_CLINIC_OR_DEPARTMENT_OTHER): Payer: Self-pay | Admitting: Registered Nurse

## 2021-04-24 LAB — COVID-19 OUTPATIENT: COVID-19 OUTPATIENT: POSITIVE

## 2021-04-24 NOTE — Telephone Encounter (Signed)
Called patient back, she has not yet spoken with supervisor. Advised that I did clarify with provider that per CDC guidelines, as long as she is improving (which she reports she is), she is outside of the initial 5 day window and is OK to have returned to work with strict masking guidelines.   She will call supervisor to discuss what their regulations are. Provided call back number to [facility] and my name in case they have any f/u questions

## 2021-04-24 NOTE — Telephone Encounter (Signed)
Spoke with patient and informed of positive COVID status.  States she has had symptoms for about 9 or 10 days already, she returned to work today but had been home since symptoms began. She is at work alone today but tomorrow her co-worker would be back. She asks whether she should go home today or, if she's okay to stay since she is alone today but stay home tomorrow since her co-worker would be back.   Reports her symptoms have improved.   In discussion, was advising that she should be okay to be returned to work, but she wants to check with her supervisor, advised I would double check with provider and I would call her back.

## 2021-05-02 ENCOUNTER — Ambulatory Visit (HOSPITAL_BASED_OUTPATIENT_CLINIC_OR_DEPARTMENT_OTHER): Payer: Self-pay | Admitting: Registered Nurse

## 2021-05-02 NOTE — Telephone Encounter (Signed)
Persistent cough  R upper lateral back chest pain when coughing  Night sweats  Fatigue   Headache  +COVID 04/23/21    Taking albuterol 2 puff twice daily  Benzonate 1 tab 2-3 times daily, last dose taken yesterday  Tylenol 1000 mg last night  Pt denies any SOB/dyspnea  No wheezing  (Pt getting ready for her sons weeding on 04/06/21)    Pt scheduled for COVID f/w appt at [place] care reviewed  Pt was encouraged to go to UC/ED if symptoms worsen - pt verbalized understanding    COVID TRIAGE - FOLLOW UP:    I have confirmed the patient's name and DOB. Yes     Emergency care:    1. Is the patient patient gasping for air or unable to speak? No  2. Does the patient have new severe chest pain? No  3. Is the patient lethargic or does the patient have altered mental status?  No    A) Reason for call (document all new or changing symptoms):    Current day of symptoms: cough, night sweats, R upper back chest pain     Describe all other new or worsening symptoms:     If dyspnea, duration of dyspnea: N/A    If fever, duration of fever: N/A    Patient known COVID positive? Yes  Patient receiving televisit followup for COVID? Yes    B) Need for in-person evaluation:    1. Is the patient experiencing respiratory symptoms (dypsnea, orthopnea, dyspnea on exertion, wheezing), worsening respiratory symptoms from baseline, or worsening of a chronic cough? No  2. Has the patient had a fever for > 48 hours? No  3. Is the patient at [place] ? 4 with worsening symptoms? Yes  4. Does the patient have chest pain (note: new severe chest pain must be referred to ED)? Yes  5. Is the patient dizzy or lightheaded? {YES CQ:[id number]  6. Does the patient have severe sore throat? No  7. Is the patient pregnant? No  8. Is the patient requesting in-person evaluation?  Yes    Disposition:    1. I have reviewed the Home Care Advice, if needed (use HOMECAREADVICE).  Yes  2. I have given the patient directions to [facility] (ACCDIRECTIONS), if  appropriate. Yes    Final disposition: Patient scheduled in [facility]

## 2021-05-02 NOTE — Telephone Encounter (Signed)
Regarding: Positive covid 19  ----- Message from Marina Goodell sent at 05/02/2021  9:04 AM EDT -----  Positive covid 19 , coughing ,sweats at nights.

## 2021-05-03 ENCOUNTER — Other Ambulatory Visit: Payer: Self-pay

## 2021-05-03 ENCOUNTER — Ambulatory Visit
Admission: RE | Admit: 2021-05-03 | Discharge: 2021-05-03 | Disposition: A | Discharge status: Home / Self Care | Payer: BC Managed Care – POS | Source: Ambulatory Visit | Attending: Internal Medicine | Admitting: Internal Medicine

## 2021-05-03 ENCOUNTER — Ambulatory Visit (HOSPITAL_BASED_OUTPATIENT_CLINIC_OR_DEPARTMENT_OTHER): Payer: BC Managed Care – POS | Admitting: Internal Medicine

## 2021-05-03 VITALS — BP 137/90 | HR 69 | Temp 98.0°F | Wt 190.6 lb

## 2021-05-03 DIAGNOSIS — J069 Acute upper respiratory infection, unspecified: Secondary | ICD-10-CM | POA: Insufficient documentation

## 2021-05-03 DIAGNOSIS — R059 Cough, unspecified: Secondary | ICD-10-CM

## 2021-05-03 MED ORDER — BENZONATATE 200 MG PO CAPS
200.00 mg | ORAL_CAPSULE | Freq: Three times a day (TID) | ORAL | 1 refills | Status: AC | PRN
Start: 2021-05-03 — End: 2021-05-17

## 2021-05-03 MED ORDER — GUAIFENESIN-CODEINE 100-10 MG/5ML PO SYRP
5.0000 mL | ORAL_SOLUTION | Freq: Three times a day (TID) | ORAL | 0 refills | Status: AC | PRN
Start: 2021-05-03 — End: 2021-05-17

## 2021-05-03 MED ORDER — FLUTICASONE PROPIONATE 50 MCG/ACT NA SUSP
1.0000 | Freq: Every day | NASAL | 0 refills | Status: DC
Start: 2021-05-03 — End: 2021-05-15

## 2021-05-03 NOTE — Progress Notes (Signed)
Acute Care Clinic Note    Subjective  Deborah Mathis 65 year old English-speaking female who presents for evaluation in acute care clinic.    HPI  65 year old female presents with covid.     3 weeks of cough.     Tested covid + on 6/20   DOI 11  Had 3 covid vaccines   Having profuse night sweats 3 x per week   Coughing a lot (Tessalon)  Shoulder pain   No dyspnea  No chest pain    ROS  No abdominal pain, nausea, vomiting, diarrhea, urinary symptoms.       Medical risks for COVID complications:  Cardiovascular disease    Social needs: No additional needs  If positive, consider warm handoff to nursing and/or [facility] referral    Per chart review prior to visit:  Review of Patient's Allergies indicates:  No Known Allergies    Objective  BP 137/90 (Site: LA, Position: Sitting, Cuff Size: Reg)    Pulse 69    Temp 98 F (36.7 C) (Temporal)    Wt 86.5 kg (190 lb 9.6 oz)    LMP 04/05/2005    SpO2 96%    BMI 37.42 kg/m      Most Recent O2 Sat Reading(s)  05/03/21 : 96%  04/23/21 : 96%  03/20/21 : 97%    Gen: No respiratory distress. Well appearing.  HEENT:  Nares patent. Tympanic membranes good light reflex. No erythema, bulging or retraction bilateral.   No tonsillar erythema, enlargement or exudate. No pta. No adenopathy.   CV: RRR  Pulm: Lung sounds clear bilateral with good air movement throughout. No rhonchi, no wheezing, no bibasilar rales  Skin: good skin turgor, not diaphoretic  Neuro: alert and oriented to time, person and place  Psych: normal affect. Normal thought content, speech, mood are noted.    Assessment  Clinical assessment:   65 year old presenting with covid. DOI 11  Outside of antiviral mab window  Afebrile here though still having night sweats, o2 96%      Plan  Will order CXR to rule out pneumonia as has been symptomatic x 1 month with night seats over past week   Tessalon   Gua with codeine     - Symptomatic treatment discussed including hydration, antipyretics, pain control, ambulation and rest.   -  Precautions given, including to call immediately or if new or worsening shortness of breath, and when to seek emergency care or call 911.          Disposition  Home  Follow up prn    Donzetta Sprung, PAC    CC to PCP: Carolin Sicks, MD

## 2021-05-15 ENCOUNTER — Other Ambulatory Visit (HOSPITAL_BASED_OUTPATIENT_CLINIC_OR_DEPARTMENT_OTHER): Payer: Self-pay | Admitting: Medical

## 2021-05-15 DIAGNOSIS — J069 Acute upper respiratory infection, unspecified: Secondary | ICD-10-CM

## 2021-05-15 NOTE — Telephone Encounter (Signed)
PER Pharmacy, Deborah Mathis is a 65 year old female has requested a refill of      - Fluticasone and Loratadine     Last Office Visit: 05/03/21 with Eliot Ford  Last Physical Exam: 07/08/18     COLONOSCOPY due on 10/12/2020     Other Med Adult:  Most Recent BP Reading(s)  05/03/21 : 137/90        Cholesterol (mg/dL)   Date Value   07/31/2020 250 (*H)     LOW DENSITY LIPOPROTEIN DIRECT (mg/dL)   Date Value   07/31/2020 173     HIGH DENSITY LIPOPROTEIN (mg/dL)   Date Value   07/31/2020 51     TRIGLYCERIDES (mg/dL)   Date Value   07/31/2020 151 (H)         No results found for: TSHSC      No results found for: TSH    HEMOGLOBIN A1C (%)   Date Value   07/31/2020 5.3       No results found for: POCA1C      No results found for: INR    SODIUM (mmol/L)   Date Value   07/31/2020 138       POTASSIUM (mmol/L)   Date Value   07/31/2020 3.8           CREATININE (mg/dL)   Date Value   07/31/2020 0.8        Documented patient preferred pharmacies:    [id number] [place], [place] - [place]  Phone: [phone] Fax: [phone]

## 2021-06-17 ENCOUNTER — Other Ambulatory Visit (HOSPITAL_BASED_OUTPATIENT_CLINIC_OR_DEPARTMENT_OTHER): Payer: Self-pay | Admitting: Internal Medicine

## 2021-06-17 DIAGNOSIS — J069 Acute upper respiratory infection, unspecified: Secondary | ICD-10-CM

## 2021-06-18 NOTE — Telephone Encounter (Signed)
PER Pharmacy, Deborah Mathis is a 65 year old female has requested a refill of      -  loratadine       Last Office Visit: 05/03/2021 with Carma Lair  Last Physical Exam: 07/08/2018     COLONOSCOPY due on 10/12/2020     Other Med Adult:  Most Recent BP Reading(s)  05/03/21 : 137/90        Cholesterol (mg/dL)   Date Value   07/31/2020 250 (*H)     LOW DENSITY LIPOPROTEIN DIRECT (mg/dL)   Date Value   07/31/2020 173     HIGH DENSITY LIPOPROTEIN (mg/dL)   Date Value   07/31/2020 51     TRIGLYCERIDES (mg/dL)   Date Value   07/31/2020 151 (H)         No results found for: TSHSC      No results found for: TSH    HEMOGLOBIN A1C (%)   Date Value   07/31/2020 5.3       No results found for: POCA1C      No results found for: INR    SODIUM (mmol/L)   Date Value   07/31/2020 138       POTASSIUM (mmol/L)   Date Value   07/31/2020 3.8           CREATININE (mg/dL)   Date Value   07/31/2020 0.8        Documented patient preferred pharmacies:    [id number] [place] [facility] [address] Phone: [phone]: [phone]

## 2021-07-18 ENCOUNTER — Other Ambulatory Visit (HOSPITAL_BASED_OUTPATIENT_CLINIC_OR_DEPARTMENT_OTHER): Payer: Self-pay | Admitting: Internal Medicine

## 2021-07-18 DIAGNOSIS — J069 Acute upper respiratory infection, unspecified: Secondary | ICD-10-CM

## 2021-07-18 NOTE — Telephone Encounter (Signed)
PER Patient (self), Deborah Mathis is a 65 year old female has requested a refill of loratadine.      Last Office Visit: 05/03/2021 with viveiros,m  Last Physical Exam: n/a    COLONOSCOPY due on 10/12/2020    Other Med Adult:  Most Recent BP Reading(s)  05/03/21 : 137/90        Cholesterol (mg/dL)   Date Value   07/31/2020 250 (*H)     LOW DENSITY LIPOPROTEIN DIRECT (mg/dL)   Date Value   07/31/2020 173     HIGH DENSITY LIPOPROTEIN (mg/dL)   Date Value   07/31/2020 51     TRIGLYCERIDES (mg/dL)   Date Value   07/31/2020 151 (H)         No results found for: TSHSC      No results found for: TSH    HEMOGLOBIN A1C (%)   Date Value   07/31/2020 5.3       No results found for: POCA1C      No results found for: INR    SODIUM (mmol/L)   Date Value   07/31/2020 138       POTASSIUM (mmol/L)   Date Value   07/31/2020 3.8           CREATININE (mg/dL)   Date Value   07/31/2020 0.8       Documented patient preferred pharmacies:    [id number] [place] [address] Phone: [phone]: [phone]

## 2021-08-15 ENCOUNTER — Other Ambulatory Visit (HOSPITAL_BASED_OUTPATIENT_CLINIC_OR_DEPARTMENT_OTHER): Payer: Self-pay | Admitting: Internal Medicine

## 2021-08-15 DIAGNOSIS — J069 Acute upper respiratory infection, unspecified: Secondary | ICD-10-CM

## 2021-08-16 NOTE — Telephone Encounter (Signed)
PER Pharmacy, Deborah Mathis is a 65 year old female has requested a refill of loratadine.      Last Office Visit: [id number] with [name]  Last Physical Exam: [id number]      Other Med Adult:  Most Recent BP Reading(s)  05/03/21 : 137/90        Cholesterol (mg/dL)   Date Value   07/31/2020 250 (*H)     LOW DENSITY LIPOPROTEIN DIRECT (mg/dL)   Date Value   07/31/2020 173     HIGH DENSITY LIPOPROTEIN (mg/dL)   Date Value   07/31/2020 51     TRIGLYCERIDES (mg/dL)   Date Value   07/31/2020 151 (H)         No results found for: TSHSC      No results found for: TSH    HEMOGLOBIN A1C (%)   Date Value   07/31/2020 5.3       No results found for: POCA1C      No results found for: INR    SODIUM (mmol/L)   Date Value   07/31/2020 138       POTASSIUM (mmol/L)   Date Value   07/31/2020 3.8           CREATININE (mg/dL)   Date Value   07/31/2020 0.8       Documented patient preferred pharmacies:    [id number] [place], [place] - [place]  Phone: [phone] Fax: [phone]

## 2021-12-14 ENCOUNTER — Other Ambulatory Visit (HOSPITAL_BASED_OUTPATIENT_CLINIC_OR_DEPARTMENT_OTHER): Payer: Self-pay | Admitting: Internal Medicine

## 2021-12-14 NOTE — Telephone Encounter (Signed)
PER Pharmacy, Deborah Mathis is a 66 year old female has requested a refill of  - omperazole    Last Office Visit: 05/03/21 with [name]  Last Physical Exam: 2019  COLONOSCOPY due on 10/12/2020  Other Med Adult:  Most Recent BP Reading(s)  05/03/21 : 137/90        Cholesterol (mg/dL)   Date Value   07/31/2020 250 (*H)     LOW DENSITY LIPOPROTEIN DIRECT (mg/dL)   Date Value   07/31/2020 173     HIGH DENSITY LIPOPROTEIN (mg/dL)   Date Value   07/31/2020 51     TRIGLYCERIDES (mg/dL)   Date Value   07/31/2020 151 (H)         No results found for: TSHSC      No results found for: TSH    HEMOGLOBIN A1C (%)   Date Value   07/31/2020 5.3       No results found for: POCA1C      No results found for: INR    SODIUM (mmol/L)   Date Value   07/31/2020 138       POTASSIUM (mmol/L)   Date Value   07/31/2020 3.8           CREATININE (mg/dL)   Date Value   07/31/2020 0.8     Documented patient preferred pharmacies:    [id number] [place], [place] - [place]  Phone: [phone] Fax: [phone]

## 2021-12-17 ENCOUNTER — Other Ambulatory Visit (HOSPITAL_BASED_OUTPATIENT_CLINIC_OR_DEPARTMENT_OTHER): Payer: Self-pay | Admitting: Internal Medicine

## 2021-12-17 DIAGNOSIS — R600 Localized edema: Secondary | ICD-10-CM

## 2021-12-17 DIAGNOSIS — I10 Essential (primary) hypertension: Secondary | ICD-10-CM

## 2021-12-17 DIAGNOSIS — E559 Vitamin D deficiency, unspecified: Secondary | ICD-10-CM

## 2021-12-18 NOTE — Telephone Encounter (Signed)
PER Pharmacy, Deborah Mathis is a 66 year old female has requested a refill of      - cholecalciferol (D-2000 MAXIMUM STRENGTH) 2000 UNIT TABS tablet    - spironolactone-hydrochlorothiazide (ALDACTAZIDE) 25-25 MG per tablet      Last Office Visit: 05/03/21 with Eliot Ford  Last Physical Exam: 07/08/18     COLONOSCOPY due on 10/12/2020     Other Med Adult:  Most Recent BP Reading(s)  05/03/21 : 137/90        Cholesterol (mg/dL)   Date Value   07/31/2020 250 (*H)     LOW DENSITY LIPOPROTEIN DIRECT (mg/dL)   Date Value   07/31/2020 173     HIGH DENSITY LIPOPROTEIN (mg/dL)   Date Value   07/31/2020 51     TRIGLYCERIDES (mg/dL)   Date Value   07/31/2020 151 (H)         No results found for: TSHSC      No results found for: TSH    HEMOGLOBIN A1C (%)   Date Value   07/31/2020 5.3       No results found for: POCA1C      No results found for: INR    SODIUM (mmol/L)   Date Value   07/31/2020 138       POTASSIUM (mmol/L)   Date Value   07/31/2020 3.8           CREATININE (mg/dL)   Date Value   07/31/2020 0.8        Documented patient preferred pharmacies:    [id number] [place], [place] - [place]  Phone: [phone] Fax: [phone]

## 2022-01-28 ENCOUNTER — Telehealth (HOSPITAL_BASED_OUTPATIENT_CLINIC_OR_DEPARTMENT_OTHER): Payer: Self-pay | Admitting: Internal Medicine

## 2022-01-28 ENCOUNTER — Encounter (HOSPITAL_BASED_OUTPATIENT_CLINIC_OR_DEPARTMENT_OTHER): Payer: Self-pay | Admitting: Student in an Organized Health Care Education/Training Program

## 2022-01-28 ENCOUNTER — Ambulatory Visit
Payer: BC Managed Care – POS | Attending: Internal Medicine | Admitting: Student in an Organized Health Care Education/Training Program

## 2022-01-28 ENCOUNTER — Other Ambulatory Visit: Payer: Self-pay

## 2022-01-28 VITALS — BP 120/80 | HR 80 | Temp 97.9°F | Ht 59.84 in | Wt 194.8 lb

## 2022-01-28 DIAGNOSIS — Z131 Encounter for screening for diabetes mellitus: Secondary | ICD-10-CM | POA: Diagnosis present

## 2022-01-28 DIAGNOSIS — Z23 Encounter for immunization: Secondary | ICD-10-CM | POA: Diagnosis present

## 2022-01-28 DIAGNOSIS — Z13820 Encounter for screening for osteoporosis: Secondary | ICD-10-CM | POA: Diagnosis present

## 2022-01-28 DIAGNOSIS — M722 Plantar fascial fibromatosis: Secondary | ICD-10-CM | POA: Insufficient documentation

## 2022-01-28 DIAGNOSIS — E785 Hyperlipidemia, unspecified: Secondary | ICD-10-CM | POA: Insufficient documentation

## 2022-01-28 DIAGNOSIS — Z0000 Encounter for general adult medical examination without abnormal findings: Secondary | ICD-10-CM | POA: Diagnosis present

## 2022-01-28 DIAGNOSIS — R937 Abnormal findings on diagnostic imaging of other parts of musculoskeletal system: Secondary | ICD-10-CM | POA: Insufficient documentation

## 2022-01-28 DIAGNOSIS — Z1211 Encounter for screening for malignant neoplasm of colon: Secondary | ICD-10-CM | POA: Diagnosis present

## 2022-01-28 NOTE — Telephone Encounter (Signed)
nurse from [facility] called the [facility] to complete a benefit analysis for the pcv20 Vaccine.    The vaccine is covered under the patient's BC/BS medical coverage.    Please choose Private

## 2022-01-28 NOTE — Progress Notes (Signed)
01/28/2022  VIS given prior to administration and reviewed with the patient and or legal guardian. Patient understands the disease and the vaccine. See immunization/Injection module or chart review for date of publication and additional information.  Janda Cargo E Jayleon Mcfarlane, LPN

## 2022-01-28 NOTE — Progress Notes (Signed)
ADULT COMPREHENSIVE EXAM    Subjective   HPI: Deborah Mathis is a 66 year old woman who presents today (01/29/22) for her annual physical exam.    Interim concerns and chronic conditions addressed today:  Problem List        Cardiac and Vasculature    Dyslipidemia     07/08/2018: Hx high TGL, borderline LDL. ASCVD risk 4.8% (low risk).            Musculoskeletal and Injuries    Plantar fasciitis     01/29/2022: Heel pain x months. Comes and goes. Worse with prolonged walking. No red or swollen. No pain at rest. No trauma or injury. Worse on the R heel. Has not tried any medications for this.   Cheri Rous, PA-C, 01/29/2022        LMP: Patient's last menstrual period was 04/05/2005.,post-menopausal      ROS: 10 pt review of systems completed and negative except as noted above per HPI      Past Medical History, Allergies, Family History, Medications, and Social History reviewed and updated        Vitals: BP 120/80    Pulse 80    Temp 97.9 F (36.6 C) (Temporal)    Ht 4' 11.84" (1.52 m)    Wt 88.4 kg (194 lb 12.8 oz)    LMP 04/05/2005    SpO2 98%    BMI 38.24 kg/m     Physical Examination:   GENERAL: well-appearing, alert, in no acute distress  HEENT: NCAT, PERRL, EOMI, oropharynx clear without erythema or exudates  NECK: supple, normal ROM, no cervical LAD  HEART: normal rate, regular rhythm, no M/R/G  LUNGS: CTAB, no rhonchi or rales, normal work of breathing  ABDOMEN: soft, NT, ND, normoactive bowel sounds, no rebound or guarding  EXT: warm and well perfused, no edema  SKIN: warm and dry, normal turgor  NEURO: alert and oriented x 3, no focal deficits  PSYCH: normal mood, normal affect    Labs/Data:   Pending per orders    Cancer Screenings:  1. Cervical Cancer: Previous abnormal pap smear? No Last pap smear date: 10/2020   2. Breast Cancer: Previous abnormal mammograms? No Patient is average risk, Last mammo date: 08/2020  3. Colon Cancer: Previous abnormal colon cancer screening? No, Last colonoscopy/iFob: 2011  4.  Skin: family history of melanoma?: No    Assessment   ASSESSMENT/PLAN: Deborah Mathis is a 66 year old woman  presenting 01/29/22 for annual physical exam, overall well-appearing.     Problem List Items Addressed This Visit        Cardiac and Vasculature    Dyslipidemia    Relevant Orders    LIPID PANEL       Musculoskeletal and Injuries    Plantar fasciitis     Hx and exam c/w plantar fasciitis. Reviewed symptomatic management, importance of supportive footwear. Given duration of symptoms, will refer to physical therapy.          Relevant Orders    REFERRAL TO PHYSICAL THERAPY (INT) (Completed)   Other Visit Diagnoses     Routine general medical examination at a health care facility    -  Primary    Relevant Orders    REFERRAL TO OPTOMETRY (EXT) (Completed)    Screening for malignant neoplasm of colon        Relevant Orders    COLONOSCOPY    Abnormal bone density screening        Osteoporosis screening  Relevant Orders    XR DXA BONE DENSITOMETRY    Screening for diabetes mellitus        Relevant Orders    HEMOGLOBIN A1C    Need for prophylactic vaccination against Streptococcus pneumoniae (pneumococcus)        Relevant Orders    IMMUNIZATION ADMIN SINGLE (Completed)    PCV20 VACCINE (PNEUMOCOCCAL/PREVNAR 20) (Completed)      Healthcare Maintenance:  A. Counseled on:      Diet and exercise     1. B. Labs/Screening:     ASCVD 10 yr risk: elevated at last check to 8.4%. Will recheck today and discuss statin indication.    Cholesterol screening q 5 years and more frequently if borderline high or cardiac risk factors    DM screening q 3 years       C. Immunizations:     Declines COVID booster   Had flu shot this year at work     Follow up: 1 year annual CPE  or sooner as needed.     Cheri Rous, PA-C, 01/29/22, 10:10 AM

## 2022-01-28 NOTE — Patient Instructions (Addendum)
[place], [place] [address]  [phone]     [place], [place], [place] [address]  [phone]

## 2022-01-29 ENCOUNTER — Encounter (HOSPITAL_BASED_OUTPATIENT_CLINIC_OR_DEPARTMENT_OTHER): Payer: Self-pay

## 2022-01-29 DIAGNOSIS — M722 Plantar fascial fibromatosis: Secondary | ICD-10-CM | POA: Insufficient documentation

## 2022-01-29 NOTE — Assessment & Plan Note (Signed)
Hx and exam c/w plantar fasciitis. Reviewed symptomatic management, importance of supportive footwear. Given duration of symptoms, will refer to physical therapy.

## 2022-02-12 ENCOUNTER — Encounter (HOSPITAL_BASED_OUTPATIENT_CLINIC_OR_DEPARTMENT_OTHER): Payer: Self-pay

## 2022-02-20 ENCOUNTER — Other Ambulatory Visit: Payer: Self-pay | Admitting: Internal Medicine

## 2022-08-27 ENCOUNTER — Encounter (HOSPITAL_BASED_OUTPATIENT_CLINIC_OR_DEPARTMENT_OTHER): Payer: Self-pay

## 2022-08-27 ENCOUNTER — Ambulatory Visit
Admission: RE | Admit: 2022-08-27 | Discharge: 2022-08-27 | Disposition: A | Discharge status: Home / Self Care | Payer: BC Managed Care – POS | Attending: Diagnostic Radiology | Admitting: Diagnostic Radiology

## 2022-08-27 ENCOUNTER — Other Ambulatory Visit: Payer: Self-pay

## 2022-08-27 DIAGNOSIS — Z1231 Encounter for screening mammogram for malignant neoplasm of breast: Secondary | ICD-10-CM | POA: Insufficient documentation

## 2022-10-30 ENCOUNTER — Encounter (HOSPITAL_BASED_OUTPATIENT_CLINIC_OR_DEPARTMENT_OTHER): Payer: Self-pay | Admitting: Internal Medicine

## 2022-10-30 ENCOUNTER — Ambulatory Visit: Payer: BC Managed Care – POS | Attending: Internal Medicine | Admitting: Internal Medicine

## 2022-10-30 ENCOUNTER — Ambulatory Visit (HOSPITAL_BASED_OUTPATIENT_CLINIC_OR_DEPARTMENT_OTHER): Payer: Self-pay | Admitting: Registered Nurse

## 2022-10-30 ENCOUNTER — Other Ambulatory Visit: Payer: Self-pay

## 2022-10-30 ENCOUNTER — Telehealth (HOSPITAL_BASED_OUTPATIENT_CLINIC_OR_DEPARTMENT_OTHER): Payer: Self-pay

## 2022-10-30 VITALS — BP 110/72 | HR 83 | Temp 97.5°F | Wt 194.6 lb

## 2022-10-30 DIAGNOSIS — R051 Acute cough: Secondary | ICD-10-CM | POA: Diagnosis present

## 2022-10-30 DIAGNOSIS — K219 Gastro-esophageal reflux disease without esophagitis: Secondary | ICD-10-CM | POA: Diagnosis present

## 2022-10-30 DIAGNOSIS — E559 Vitamin D deficiency, unspecified: Secondary | ICD-10-CM

## 2022-10-30 DIAGNOSIS — Z1211 Encounter for screening for malignant neoplasm of colon: Secondary | ICD-10-CM | POA: Diagnosis present

## 2022-10-30 DIAGNOSIS — B001 Herpesviral vesicular dermatitis: Secondary | ICD-10-CM | POA: Diagnosis present

## 2022-10-30 DIAGNOSIS — I10 Essential (primary) hypertension: Secondary | ICD-10-CM | POA: Insufficient documentation

## 2022-10-30 DIAGNOSIS — Z20822 Contact with and (suspected) exposure to covid-19: Secondary | ICD-10-CM | POA: Diagnosis not present

## 2022-10-30 DIAGNOSIS — Z131 Encounter for screening for diabetes mellitus: Secondary | ICD-10-CM | POA: Diagnosis present

## 2022-10-30 LAB — RESPIRATORY PANEL BASIC OUTPT
INFLUENZA A: POSITIVE
INFLUENZA B: NEGATIVE
RESPIRATORY SYNCYTIAL VIRUS: NEGATIVE
SARS-COV-2: NEGATIVE

## 2022-10-30 LAB — BASIC METABOLIC PANEL
ANION GAP: 13 mmol/L (ref 10–22)
BUN (UREA NITROGEN): 16 mg/dL (ref 7–18)
CALCIUM: 9.8 mg/dL (ref 8.5–10.5)
CARBON DIOXIDE: 28 mmol/L (ref 21–32)
CHLORIDE: 100 mmol/L (ref 98–107)
CREATININE: 0.8 mg/dL (ref 0.4–1.2)
ESTIMATED GLOMERULAR FILT RATE: >60 mL/min (ref >60)
Glucose Random: 130 mg/dL (ref 74–160)
POTASSIUM: 3.7 mmol/L (ref 3.5–5.1)
SODIUM: 140 mmol/L (ref 136–145)

## 2022-10-30 LAB — LIPID PANEL
Cholesterol: 208 mg/dL (ref 0–239)
HIGH DENSITY LIPOPROTEIN: 41 mg/dL (ref 40–60)
LOW DENSITY LIPOPROTEIN DIRECT: 136 mg/dL (ref 0–189)
TRIGLYCERIDES: 168 mg/dL — ABNORMAL HIGH (ref 0–150)

## 2022-10-30 LAB — HEMOGLOBIN A1C
ESTIMATED AVERAGE GLUCOSE: 123 mg/dL (ref 74–160)
HEMOGLOBIN A1C: 5.9 % — ABNORMAL HIGH (ref 4.0–5.6)

## 2022-10-30 MED ORDER — SPIRONOLACTONE-HCTZ 25-25 MG PO TABS
1.0000 | ORAL_TABLET | Freq: Every day | ORAL | 3 refills | Status: DC
Start: 2022-10-30 — End: 2023-12-15

## 2022-10-30 MED ORDER — VITAMIN D3 50 MCG (2000 UT) PO TABS
ORAL_TABLET | ORAL | 3 refills | Status: DC
Start: 2022-10-30 — End: 2024-04-06

## 2022-10-30 MED ORDER — ACYCLOVIR 5 % EX OINT
TOPICAL_OINTMENT | CUTANEOUS | 0 refills | Status: AC
Start: 2022-10-30 — End: 2022-11-06

## 2022-10-30 MED ORDER — ALBUTEROL SULFATE HFA 108 (90 BASE) MCG/ACT IN AERS
2.0000 | INHALATION_SPRAY | RESPIRATORY_TRACT | 0 refills | Status: DC
Start: 2022-10-30 — End: 2022-11-21

## 2022-10-30 MED ORDER — OMEPRAZOLE 20 MG PO CPDR
DELAYED_RELEASE_CAPSULE | ORAL | 3 refills | Status: DC
Start: 2022-10-30 — End: 2023-11-30

## 2022-10-30 NOTE — Progress Notes (Signed)
Deborah Mathis is a 66 year old female with an URI for four days.  Not getting worse.  No fever or SOB.  Tested negative for COVID on Xmas.  Wheezing.  She has had wheezing in the past with URI.  She also has a fever blister on her left lower lip.    She is overdue for a number of care interventions.  Advised her to catch up on COVID and RSV vaccines when she feels better.  She had flu shot at work.    We will get the labs today that are due.    We talked about colon cancer screening.  She is at average risk and is 2 years overdue for colonoscopy.  Will get Cologard out of expedience.  It's a good starting position.    BP 110/72   Pulse 83   Temp 97.5 F (36.4 C) (Temporal)   Wt 88.3 kg (194 lb 9.6 oz)   LMP 04/05/2005   SpO2 98%   BMI 38.21 kg/m   EENT with coryza  Fever blister left lower lip  No nodes  Lungs clear    (R05.1) Acute cough  (primary encounter diagnosis)  Comment: will check a respiratory panel to see if she has a diagnosable virus  Plan: RESPIRATORY PANEL BASIC OUTPT             (B00.1) Fever blister  Comment: topical acylovir  Plan: acyclovir (ZOVIRAX) 5 % ointment             (I10) Essential hypertension  Comment: BP controlled on Rx. Check lab and renew Rx  Plan: BASIC METABOLIC PANEL, LIPID PANEL,         spironolactone-hydrochlorothiazide         (ALDACTAZIDE) 25-25 MG per tablet             (E55.9) Vitamin D deficiency  Comment: renew Rx  Plan: Cholecalciferol (VITAMIN D3) 50 MCG (2000 UT)         TABS             (K21.9) Gastroesophageal reflux disease, unspecified whether esophagitis present  Comment: renew Rx  Plan: omeprazole (PRILOSEC) 20 MG capsule             (Z13.1) Screening for diabetes mellitus  Comment: screen with lab  Plan: HEMOGLOBIN A1C             (Z12.11) Screening for colon cancer  Comment: screen with Cologard  Plan: STOOL DNA             Current Outpatient Medications   Medication Sig    albuterol HFA (PROAIR HFA) 108 (90 Base) MCG/ACT inhaler Inhale 2 puffs into the  lungs See Admin Instructions Using spacer, inhale 1-2 puffs every 6 hours for 3 days, then as needed.  for 14 days    spironolactone-hydrochlorothiazide (ALDACTAZIDE) 25-25 MG per tablet Take 1 tablet by mouth in the morning. Please make an appointment with your PCP team prior to refills.    omeprazole (PRILOSEC) 20 MG capsule TAKE 1 CAPSULE BY MOUTH EVERY DAY AS NEEDED    Cholecalciferol (VITAMIN D3) 50 MCG (2000 UT) TABS TAKE 1 TABLET BY MOUTH EVERY DAY IN THE MORNING    acyclovir (ZOVIRAX) 5 % ointment Apply topically every 3 (three) hours  for 7 days     No current facility-administered medications for this visit.

## 2022-10-30 NOTE — Patient Instructions (Signed)
I advise RSV vaccine at your pharmacy.

## 2022-10-30 NOTE — Telephone Encounter (Signed)
Message from [place] sent at 10/30/2022  9:52 AM EST    Summary: cold    Pt calls to discuss cold symptoms              Reason for Disposition   Patient wants to be seen    Answer Assessment - Initial Assessment Questions  Deborah Seltzer, RN, 10/30/2022  TC from pt transferred to NAL.  RN confirmed pt's name and DOB.  Pt reports started with a cough last Friday/Saturday.  Tested for Covid on Sun and was neg.  States now is hearing "wheezing" in her lungs.  No PMH of COPD, asthma etc.  Afeb.  Selt tx'ng with Ibuprofen, tylenol, Delsyn, Mucinex without much relief.  Agreeable to in person eval at [facility].  To go now.    Protocols used: Cough-A-OH    Advised per nursing triage protocol.  Verbalized understanding and agreement with instructions and disposition.     Recommended disposition for patient:Disposition: Advised to go to Urgent Care for Evaluation due to appointment availablity    If patient referred to UC/ED advised that they may require further follow up and testing after the visit with their primary care office.     Instructed patient to call back for any new, worsening, or worrisome symptoms or concerns any time day or night.

## 2022-10-30 NOTE — Telephone Encounter (Signed)
This RN contacted patient via telephone number in chart per MD Janett request; patient spoke with triage RN today d/t URI s/sx  Two patient identifiers used   Patient scheduled with PCP today at 11:40   All questions and concerns addressed  Patient understood plan and agreeable with this plan

## 2022-10-31 ENCOUNTER — Telehealth (HOSPITAL_BASED_OUTPATIENT_CLINIC_OR_DEPARTMENT_OTHER): Payer: Self-pay | Admitting: Registered Nurse

## 2022-10-31 NOTE — Telephone Encounter (Signed)
Called and spoke with Verdis Frederickson at this time.   Results message + Flu A provided to patient.  Educated patient this is a highly contagious viral illness.  Home care instructions provided to rest, hydrate, use good handwashing, can use saline nasal spray, humidifiers, and neti pot as needed.  Advised to call us for any worsening symptoms.   Patient verbalized understanding without further questions or concerns at this time for RN.   Esaw Dace, RN 10/31/22 8:08 AM               10/30/2022  12:22 PM   SARS-COV-2 Negative for SARS-CoV-2(2019 novel coronavirus)by RT-PCR.    Influenza A  Positive for influenza A by PCR methodology.    Influenza A  influenza A !    INFLUENZA B Negative for Influenza B by PCR methodology.    RESPIRATORY SYNCYTIAL VIRUS Negative for Respiratory Syncytial Virus by PCR methodology.       Legend:  ! Abnormal

## 2022-11-01 ENCOUNTER — Encounter (HOSPITAL_BASED_OUTPATIENT_CLINIC_OR_DEPARTMENT_OTHER): Payer: Self-pay | Admitting: Internal Medicine

## 2022-11-05 ENCOUNTER — Encounter (HOSPITAL_BASED_OUTPATIENT_CLINIC_OR_DEPARTMENT_OTHER): Payer: Self-pay

## 2022-11-06 ENCOUNTER — Encounter (HOSPITAL_BASED_OUTPATIENT_CLINIC_OR_DEPARTMENT_OTHER): Payer: Self-pay | Admitting: Internal Medicine

## 2022-11-11 ENCOUNTER — Encounter (HOSPITAL_BASED_OUTPATIENT_CLINIC_OR_DEPARTMENT_OTHER): Payer: Self-pay

## 2022-11-21 ENCOUNTER — Other Ambulatory Visit (HOSPITAL_BASED_OUTPATIENT_CLINIC_OR_DEPARTMENT_OTHER): Payer: Self-pay | Admitting: Internal Medicine

## 2022-11-21 DIAGNOSIS — J069 Acute upper respiratory infection, unspecified: Secondary | ICD-10-CM

## 2022-11-21 NOTE — Telephone Encounter (Signed)
PER Pharmacy, Deborah Mathis is a 67 year old female has requested a refill of      -  albuterol inh       Last Office Visit: 10-30-2022 with pcp  Last Physical Exam:      There are no preventive care reminders to display for this patient.     Other Med Adult:  Most Recent BP Reading(s)  10/30/22 : 110/72        Cholesterol (mg/dL)   Date Value   10/30/2022 208     LOW DENSITY LIPOPROTEIN DIRECT (mg/dL)   Date Value   10/30/2022 136     HIGH DENSITY LIPOPROTEIN (mg/dL)   Date Value   10/30/2022 41     TRIGLYCERIDES (mg/dL)   Date Value   10/30/2022 168 (H)         No results found for: "TSHSC"      No results found for: "TSH"    HEMOGLOBIN A1C (%)   Date Value   10/30/2022 5.9 (H)       No results found for: "POCA1C"      No results found for: "INR"    SODIUM (mmol/L)   Date Value   10/30/2022 140       POTASSIUM (mmol/L)   Date Value   10/30/2022 3.7           CREATININE (mg/dL)   Date Value   10/30/2022 0.8        Documented patient preferred pharmacies:    [id number] [place] [facility] [address] Phone: [phone]: [phone]

## 2023-03-12 ENCOUNTER — Ambulatory Visit (HOSPITAL_BASED_OUTPATIENT_CLINIC_OR_DEPARTMENT_OTHER): Payer: BC Managed Care – POS | Admitting: Internal Medicine

## 2023-03-18 ENCOUNTER — Other Ambulatory Visit: Payer: Self-pay

## 2023-03-18 ENCOUNTER — Ambulatory Visit (HOSPITAL_BASED_OUTPATIENT_CLINIC_OR_DEPARTMENT_OTHER): Payer: BC Managed Care – POS | Admitting: Internal Medicine

## 2023-03-18 DIAGNOSIS — R7303 Prediabetes: Secondary | ICD-10-CM

## 2023-03-18 DIAGNOSIS — Z13820 Encounter for screening for osteoporosis: Secondary | ICD-10-CM

## 2023-04-01 ENCOUNTER — Other Ambulatory Visit: Payer: Self-pay

## 2023-04-01 ENCOUNTER — Ambulatory Visit: Payer: BC Managed Care – PPO | Attending: Internal Medicine | Admitting: Internal Medicine

## 2023-04-01 ENCOUNTER — Encounter (HOSPITAL_BASED_OUTPATIENT_CLINIC_OR_DEPARTMENT_OTHER): Payer: Self-pay | Admitting: Internal Medicine

## 2023-04-01 VITALS — BP 119/77 | HR 62 | Temp 96.5°F | Ht 61.0 in | Wt 198.6 lb

## 2023-04-01 DIAGNOSIS — J4520 Mild intermittent asthma, uncomplicated: Secondary | ICD-10-CM | POA: Diagnosis present

## 2023-04-01 DIAGNOSIS — R7303 Prediabetes: Secondary | ICD-10-CM | POA: Diagnosis present

## 2023-04-01 DIAGNOSIS — Z13820 Encounter for screening for osteoporosis: Secondary | ICD-10-CM | POA: Insufficient documentation

## 2023-04-01 DIAGNOSIS — I10 Essential (primary) hypertension: Secondary | ICD-10-CM | POA: Diagnosis present

## 2023-04-01 DIAGNOSIS — Z6837 Body mass index (BMI) 37.0-37.9, adult: Secondary | ICD-10-CM | POA: Insufficient documentation

## 2023-04-01 DIAGNOSIS — Z1211 Encounter for screening for malignant neoplasm of colon: Secondary | ICD-10-CM | POA: Diagnosis present

## 2023-04-01 LAB — BASIC METABOLIC PANEL
ANION GAP: 13 mmol/L (ref 10–22)
BUN (UREA NITROGEN): 21 mg/dL — ABNORMAL HIGH (ref 7–18)
CALCIUM: 9.8 mg/dL (ref 8.5–10.5)
CARBON DIOXIDE: 25 mmol/L (ref 21–32)
CHLORIDE: 104 mmol/L (ref 98–107)
CREATININE: 0.8 mg/dL (ref 0.4–1.2)
ESTIMATED GLOMERULAR FILT RATE: >60 mL/min (ref >60)
Glucose Random: 95 mg/dL (ref 74–160)
POTASSIUM: 3.9 mmol/L (ref 3.5–5.1)
SODIUM: 141 mmol/L (ref 136–145)

## 2023-04-01 LAB — HEMOGLOBIN A1C
ESTIMATED AVERAGE GLUCOSE: 111 mg/dL (ref 74–160)
HEMOGLOBIN A1C: 5.5 % (ref 4.0–5.6)

## 2023-04-01 NOTE — Patient Instructions (Addendum)
Get your RSV vaccine at the pharmacy.    Tell the pharmacy that you want a vacation supply of medicine if you need to refill early.    GI will call you but if you don't hear from them you can call directly to book colonoscopy:  Endoscopy/Colonoscopy: [phone]    Book your bone density:  Radiology: [phone]

## 2023-04-01 NOTE — Progress Notes (Signed)
Deborah Mathis is a 67 year old female who returns to see me to review:  Essential hypertension  (primary encounter diagnosis)  Screening for osteoporosis  Prediabetes  Screening for colon cancer  Mild intermittent reactive airway disease without complication  BMI 37.0-37.9, adult    She has been doing well overall.  Plans a trip to [place] soon.    She has hypertension well controlled with Aldactazide.  She still gets some ankle edema despite the diuretic.    Her weight has gone up.    Most Recent Weight Reading(s)  04/01/23 : 90.1 kg (198 lb 9.6 oz)  10/30/22 : 88.3 kg (194 lb 9.6 oz)  08/27/22 : 88.4 kg (194 lb 14.2 oz)  01/28/22 : 88.4 kg (194 lb 12.8 oz)  05/03/21 : 86.5 kg (190 lb 9.6 oz)    The weight and the edema are related.  So is the glucose metabolism.  She is known to have pre diabetes and we will be rechecking an A1C today.  If she has overt diabetes we could offer her a GLP1 agonist.  She is not terribly interested in a drug that will reduce her appetite.    She is willing to have screening for osteoporosis and colon cancer.  We talked about the colon cancer screening options and she prefers colonoscopy.    Reactive airway disease is quite intermittent.  A can of albuterol lasts her 4 or more months.    BP 119/77   Pulse 62   Temp 96.5 F (35.8 C) (Temporal)   Ht 5\' 1"  (1.549 m)   Wt 90.1 kg (198 lb 9.6 oz)   LMP 04/05/2005   SpO2 95%   BMI 37.53 kg/m   Clear lungs  Trace ankle edema    (I10) Essential hypertension  (primary encounter diagnosis)  Comment: will check BMP,  BP is at target today  Plan: BASIC METABOLIC PANEL             (Z13.820) Screening for osteoporosis  Comment: she will call to schedule a BMD test  Plan: XR DXA BONE DENSITOMETRY             (R73.03) Prediabetes  Comment: concern regarding weight gain.  Recheck A1c today  Plan: HEMOGLOBIN A1C             (Z12.11) Screening for colon cancer  Comment: she will call to schedule colonoscopy.  She did not call back the last time  they called her for outreach, but she will do it this time  Plan: COLONOSCOPY             (J45.20) Mild intermittent reactive airway disease without complication  Comment: she is okay on intermittent albuterol     (Z68.37) BMI 37.0-37.9, adult  Comment: discussion about diet     Current Outpatient Medications   Medication Sig    albuterol HFA 108 (90 Base) MCG/ACT inhaler Inhale 2 puffs into the lungs See Admin Instructions Using spacer, inhale 1-2 puffs every 6 hours for 3 days, then as needed.    spironolactone-hydrochlorothiazide (ALDACTAZIDE) 25-25 MG per tablet Take 1 tablet by mouth in the morning. Please make an appointment with your PCP team prior to refills.    omeprazole (PRILOSEC) 20 MG capsule TAKE 1 CAPSULE BY MOUTH EVERY DAY AS NEEDED    Cholecalciferol (VITAMIN D3) 50 MCG (2000 UT) TABS TAKE 1 TABLET BY MOUTH EVERY DAY IN THE MORNING     No current facility-administered medications for this visit.

## 2023-04-02 ENCOUNTER — Encounter (HOSPITAL_BASED_OUTPATIENT_CLINIC_OR_DEPARTMENT_OTHER): Payer: Self-pay

## 2023-04-11 ENCOUNTER — Encounter (HOSPITAL_BASED_OUTPATIENT_CLINIC_OR_DEPARTMENT_OTHER): Payer: Self-pay

## 2023-04-11 NOTE — Progress Notes (Signed)
Colonoscopy order received, Spoke to patient.     Patient has been scheduled on 07/02/23    [facility].    Nurse to call and mail instructions prior to procedure

## 2023-05-02 ENCOUNTER — Encounter (HOSPITAL_BASED_OUTPATIENT_CLINIC_OR_DEPARTMENT_OTHER): Payer: Self-pay | Admitting: Internal Medicine

## 2023-06-11 ENCOUNTER — Other Ambulatory Visit (HOSPITAL_BASED_OUTPATIENT_CLINIC_OR_DEPARTMENT_OTHER): Payer: Self-pay

## 2023-06-11 MED ORDER — PEG 3350-KCL-NABCB-NACL-NASULF 236 G PO SOLR
4.0000 L | ORAL | 0 refills | Status: DC
Start: 2023-06-11 — End: 2023-08-18

## 2023-06-11 MED ORDER — SIMETHICONE 80 MG PO TABS
4.0000 | ORAL_TABLET | ORAL | 0 refills | Status: DC
Start: 2023-06-11 — End: 2023-08-18

## 2023-06-11 NOTE — Telephone Encounter (Signed)
Left message in patient's voicemail.   Confirmed patient's procedure on 07/02/23 at  8:15am.    Ride home requirement reviewed.    Asked patient to call [phone] or [phone] with any questions or cancellation request.    SD1 prep instructions sent via mail and RX sent to [facility].

## 2023-06-25 ENCOUNTER — Encounter (HOSPITAL_BASED_OUTPATIENT_CLINIC_OR_DEPARTMENT_OTHER): Payer: Self-pay

## 2023-06-25 NOTE — Progress Notes (Signed)
Chart reviewed for SGLT-2 meds, none noted.

## 2023-06-25 NOTE — Progress Notes (Signed)
Called pt to confirm colonoscopy for next week. Patient requested to reschedule. Patient transferred to front desk.

## 2023-08-15 ENCOUNTER — Other Ambulatory Visit (HOSPITAL_BASED_OUTPATIENT_CLINIC_OR_DEPARTMENT_OTHER): Payer: Self-pay

## 2023-08-15 NOTE — Telephone Encounter (Signed)
Called and spoke with patient, confirmed colonoscopy on 09/01/23, arrival time 8:45 AM at [facility].  Need for ride requirements reviewed.  Patient states she was unable to tolerate Golytely, MD1 prep info mailed to confirmed address, prep meds sent.  Confirmed patient is not on anticoagulants, SGLT-2 or GLP-1 meds.    Colonoscopy Instructions  Miralax and Dulcolax Preparation    WHAT TO BUY  4 Dulcolax (or generic bisacodyl laxative) tablets  1 large bottle of Miralax (8.3 ounces or 238 grams)  64 ounces of Gatorade (no red or purple colors)  7 DAYS BEFORE THE TEST        DO NOT TAKE: Nonsteroidal anti-inflammatory medications, these include Ibuprofen, Motrin, Advil, Naproxen, Aleve, Naprosyn, Meloxicam, and many others  You may take Tylenol (which is acetaminophen) for pain  Some medical conditions require you to stay on Aspirin. Check with your primary care provider. Do not stop Aspirin until you speak with your health care provider.   3 DAYS BEFORE THE TEST      Stop eating high fiber foods such as corn, beans, seeds, nuts, whole grain breads, or fruit skins (pear, apple, etc.)    2 DAYS BEFORE THE TEST  Have a light dinner no later than 7 PM    1 DAY BEFORE THE TEST  Begin a clear liquid diet - a clear liquid means you can see through it    Clear Liquids Not Clear Liquids   Water, Gatorade, Powerade, or Pedialyte No red or purple items   Black coffee or tea (no milk, cream) No alcohol   Clear broth No milk, cream, other dairy products   Ginger ale or Sprite No noodles, rice, or vegetables in soup   Apple juice No juice with pulp   Jell-o, popsicles  No liquid you cannot see through     No solid food for the entire day  In the morning: take 2 dulcolax tablets  Prepare the laxative: mix 64 oz of Gatorade with 8.3 oz of Miralax, then put it in the refrigerator  Starting at 4 PM, drink one glass (8 ounces) of laxative every 30 minutes until finished. Be sure to stay close to a bathroom once you start the prep.  At 9 PM,  take 2 gas pills (simethicone) with 1 glass (8 ounces) of clear liquid  At 10 PM, take 2 more gas pills (simethicone) with 1 glass (8 ounces) of clear liquid  Before bed: take 2 dulcolax tablets    DAY OF THE COLONOSCOPY  The morning of the test, you can take your other medications at the usual time with only a sip of water. These include blood pressure pills, seizure medications, heart medications, thyroid medications, etc.  Do not take pills for diabetes. If you have diabetes, please follow the colonoscopy preparation for people with diabetes handout  Do not drink anything for 3 hours before the test

## 2023-08-18 MED ORDER — POLYETHYLENE GLYCOL 3350 17 GM/SCOOP PO POWD
238.0000 g | ORAL | 0 refills | Status: DC
Start: 2023-08-18 — End: 2024-04-06

## 2023-08-18 MED ORDER — SIMETHICONE 80 MG PO TABS
4.0000 | ORAL_TABLET | ORAL | 0 refills | Status: DC
Start: 2023-08-18 — End: 2024-04-06

## 2023-08-18 MED ORDER — BISACODYL 5 MG PO TBEC
20.0000 mg | DELAYED_RELEASE_TABLET | ORAL | 0 refills | Status: DC
Start: 2023-08-18 — End: 2024-04-06

## 2023-08-25 ENCOUNTER — Encounter (HOSPITAL_BASED_OUTPATIENT_CLINIC_OR_DEPARTMENT_OTHER): Payer: Self-pay

## 2023-08-25 NOTE — Progress Notes (Signed)
Confirmed 10/28 arrival 0930; +ride/prep -- instructions mailed.

## 2023-09-01 ENCOUNTER — Encounter (HOSPITAL_BASED_OUTPATIENT_CLINIC_OR_DEPARTMENT_OTHER): Payer: Self-pay

## 2023-09-01 ENCOUNTER — Encounter (HOSPITAL_BASED_OUTPATIENT_CLINIC_OR_DEPARTMENT_OTHER): Payer: Self-pay | Admitting: Anesthesiology

## 2023-09-01 ENCOUNTER — Ambulatory Visit (HOSPITAL_BASED_OUTPATIENT_CLINIC_OR_DEPARTMENT_OTHER): Payer: Self-pay | Admitting: Anesthesiology

## 2023-09-01 ENCOUNTER — Ambulatory Visit
Admission: RE | Admit: 2023-09-01 | Discharge: 2023-09-01 | Disposition: A | Discharge status: Home / Self Care | Payer: BC Managed Care – PPO | Attending: Internal Medicine | Admitting: Internal Medicine

## 2023-09-01 ENCOUNTER — Other Ambulatory Visit: Payer: Self-pay

## 2023-09-01 DIAGNOSIS — I10 Essential (primary) hypertension: Secondary | ICD-10-CM | POA: Diagnosis not present

## 2023-09-01 DIAGNOSIS — E785 Hyperlipidemia, unspecified: Secondary | ICD-10-CM | POA: Insufficient documentation

## 2023-09-01 DIAGNOSIS — K648 Other hemorrhoids: Secondary | ICD-10-CM | POA: Insufficient documentation

## 2023-09-01 DIAGNOSIS — Z1211 Encounter for screening for malignant neoplasm of colon: Secondary | ICD-10-CM | POA: Insufficient documentation

## 2023-09-01 MED ORDER — LIDOCAINE HCL (PF) 2 % IJ SOLN
INTRAMUSCULAR | Status: AC
Start: 2023-09-01 — End: 2023-09-01
  Filled 2023-09-01: qty 5

## 2023-09-01 MED ORDER — LIDOCAINE HCL (PF) 2 % IJ SOLN
Freq: Once | INTRAMUSCULAR | Status: DC | PRN
Start: 2023-09-01 — End: 2023-09-01
  Administered 2023-09-01: 5 mL via INTRAVENOUS

## 2023-09-01 MED ORDER — PROPOFOL 200 MG/20 ML IV - AN
Freq: Once | INTRAVENOUS | Status: DC | PRN
Start: 2023-09-01 — End: 2023-09-01
  Administered 2023-09-01: 150 ug/kg/min via INTRAVENOUS
  Administered 2023-09-01: 88 mg via INTRAVENOUS

## 2023-09-01 MED ORDER — ONDANSETRON HCL 4 MG/2ML IJ SOLN
Freq: Once | INTRAMUSCULAR | Status: DC | PRN
Start: 2023-09-01 — End: 2023-09-01
  Administered 2023-09-01: 4 mg via INTRAVENOUS

## 2023-09-01 MED ORDER — ONDANSETRON HCL 4 MG/2ML IJ SOLN
INTRAMUSCULAR | Status: AC
Start: 2023-09-01 — End: 2023-09-01
  Filled 2023-09-01: qty 2

## 2023-09-01 MED ORDER — PROPOFOL 200 MG/20ML IV EMUL
INTRAVENOUS | Status: AC
Start: 2023-09-01 — End: 2023-09-01
  Filled 2023-09-01: qty 40

## 2023-09-01 NOTE — Anesthesia Postprocedure Evaluation (Signed)
Anesthesia Post-Operative Evaluation Note    Patient: Deborah Mathis           Procedure Summary       Date: 09/01/23 Room / Location: [facility]    Anesthesia Start:  Anesthesia Stop:     Procedure: COLONOSCOPY Diagnosis: Screening for colon cancer    Scheduled Providers: Joretta Bachelor., MD Responsible Provider:     Anesthesia Type: general, TIVA ASA Status: 2              POST-OPERATIVE EVALUATION    Anesthesia Post Evaluation    Vitals signs in patient's normal range: Yes  Respiratory function stable; airway patent: Yes  Cardiovascular function stable: Yes  Hydration status stable: Yes  Mental status recovered; patient participates in evaluation and/or is at baseline: Yes  Pain control satisfactory: Yes  Nausea and vomiting control satisfactory: YesProcedure was labor & delivery no  PostOP disposition PACU  Anesthesia Observation no significant observation    MIPS#404 Anesthesiology Smoking Abstinence:     The patient is a current smoker (e.g. cigarette, cigar, pipe, e-cigarette/vaping/marijuana) (BJ478): No   FOR CODING USE ONLY: IF BLANK G9562    MIPS#477 Multimodal Pain Management:  Emergent - Exclusion case: No  Patient was administered multimodal pain management (two or more drugs and/or interventions excluding systemic opioids) in the perioperative period; occurring at some time between 6 hours prior to anesthesia start time until discharged from: No   List medical reason(s) patient did not receive multimodal pain management (Z3086): no pain anticipated postop and patient reports no  pain in PACU  FOR CODING USE ONLY: REASON NOT LISTED V7846    NGEX#528 Perioperative Temperature Management:  Anesthesia start to Anesthesia end time was 60 minutes or longer (4256F): No   FOR CODING USE ONLY: REASON NOT LISTED U1324    MIPS#430 Adult Prevention of PONV:  Patient received an inhalational anesthetic (MW102): No  FOR CODING USE ONLY: REASON NOT LISTED V2536    MIPS#463 Pediatric  Prevention of PONV:  Pediatric patient?: No  FOR CODING USE ONLY: REASON NOT LISTED U4403        [employer] # [id number]          Last vitals    BP: 122/75 (09/01/2023 10:16 AM)  Temp: 97.4 F (36.3 C) (09/01/2023  9:52 AM)  Pulse: 67 (09/01/2023 10:16 AM)  Resp: 18 (09/01/2023 10:16 AM)  SpO2: 94 % (09/01/2023 10:16 AM)

## 2023-09-01 NOTE — PROVATION-GI (Signed)
[facility]  Patient Name: Diasy Savitz  MRN: [id number]  CSN: [id number]  Date of Birth: 22-Jul-1956  Admit Type: Outpatient  Age: 67  Gender: Female  Note Status: Finalized  Patient Location: [facility]  Referring MD:          Linton Rump MD, MD  Procedure Date:        09/01/2023 9:06:24 AM  Procedure:             Colonoscopy  Endoscopist:           Joretta Bachelor MD, MD  Indications for Procedure:       Screening for colorectal malignant neoplasm  Medications:           Monitored Anesthesia Care  Procedure:       Just prior to the procedure, an updated history and physical was done. I        obtained an informed consent from the patient reviewing the risk of the        procedure including (but not limited to) respiratory depression, perforation,        bleeding, discomfort, a possible need for surgery and unexpected reactions to        medications. The patient is aware that test has limitations and may not        detect significant lesions such as cancer or other potential diseases. The        patient was also informed that they might need a repeat colonoscopy earlier        than standard guidelines if there are changes in their symptoms or concerning        findings noted. A time out was performed with the entire procedure staff        present. The scope was passed under direct vision. Throughout the procedure,        the patient's blood pressure, pulse, and oxygen saturations were monitored        continuously. The [id number] was introduced through the anus and        advanced to the cecum, identified by appendiceal orifice and ileocecal valve.        The scope was then slowly withdrawn with confirmation of the noted findings.        The colonoscopy was performed without difficulty. The patient tolerated the        procedure well.  Findings:       Non-bleeding internal hemorrhoids were found during retroflexion. The        hemorrhoids were medium-sized.  Scope Withdrawal Time: 0 hours 6  minutes 51 seconds   Total Procedure Duration: 0 hours 12 minutes 37 seconds   Post Procedure Diagnosis:       - Non-bleeding internal hemorrhoids.       - No specimens collected.  Complications:         No immediate complications.  Recommendation:       - Repeat colonoscopy in 10 years for screening purposes.  Joretta Bachelor., MD  Clerance Lav Gwyneth Sprout MD, MD  09/01/2023 10:27:08 AM  This report has been signed electronically.  Number of Addenda: 0  Note Initiated On: 09/01/2023 9:06 AM

## 2023-09-01 NOTE — H&P (Signed)
GI Pre-procedure History and Physical Short Form  Deborah Mathis is an 67 year old female.    Chief Complaint: She is here for Colonoscopy    HPI: 67 year old female of average risk for colon cancer.     Active Problems:  Patient Active Problem List:     Carpal tunnel syndrome     Nonspecific reaction to tuberculin skin test without active tuberculosis     Iron deficiency anemia     Uterine fibroid     Hydradenitis     Seasonal allergies     Vitamin D deficiency     Arthritis of both hands     Essential hypertension     Edema of lower extremity     Acute left ankle pain     Dyslipidemia     Reactive airway disease without complication     BMI 37.0-37.9, adult     Plantar fasciitis      Past Medical History:   Past Medical History:  No date: No known problems    Past Surgical History:  History reviewed. No pertinent surgical history.    Social History:  Social History     Socioeconomic History    Marital status: Married     Spouse name: Not on file    Number of children: Not on file    Years of education: Not on file    Highest education level: Not on file   Occupational History    Not on file   Tobacco Use    Smoking status: Never    Smokeless tobacco: Never   Substance and Sexual Activity    Alcohol use: No     Comment: 1 drin every 1-2 montrhs    Drug use: No    Sexual activity: Yes     Partners: Male     Comment: One female partner   Other Topics Concern    Military Service No    Blood Transfusions Not Asked    Caffeine Concern Not Asked    Occupational Exposure No     Comment: repetitive motion hands    Hobby Hazards Not Asked    Sleep Concern Yes    Stress Concern No    Weight Concern Yes    Special Diet No     Comment: many vegetables    Back Care Not Asked    Exercise No     Comment: walking 90 min, while unemployed    Bike Helmet Not Asked    Seat Belt Yes    Self-Exams Not Asked   Social History Narrative    07/08/2018:    At [employer] x5 years.    Lives with husband. One son (63 y/o) who lives at home    Feels safe at  home            Born: Born [place], came to [place] age 89    Family: lives with husband and son (b. 1991)    Education: has GED    Social: 3 bros and 1 sister living in [place] (one brother dec.d/t CAD)    Occupation:assembly line work    Leisure:    2012: just completed 20 years at her job        2013:    Lost job x 4 months; now working at [employer].        11/14/2014    NOw working [employer].    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 Has to cook for family     Walks in lunch time and brake about every day . For 20-25 min     + sit belt     Using sun block lotion sometimes     Works : Merchandiser, retail , Midwife. Likes it. Works full time     [place]: taking care about her 55 y/o neighbor , share food that cooks for family . Runs the house . Goes to sister, seeing little nieces         01/28/2022    Work/School: Might retire soon, the end of next year she's considering. 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[employer], PA-C                               Social Determinants of Health  Financial Resource Strain: Not on file  Food Insecurity: Not on file  Transportation Needs: Not on file  Physical Activity: Not on file  Stress: Not on file  Social Connections: Not on file  Intimate Partner Violence: Not on file  Housing Stability: Not on file    Family History:  Review of patient's family history indicates:  Problem: Heart      Relation: Brother          Age of Onset: (Not Specified)          Comment: deceased  Problem: Heart      Relation: Brother          Age of Onset: (Not Specified)  Problem: Heart      Relation: Father          Age of Onset: (Not Specified)          Comment: deceased  Problem: Heart       Relation: Mother          Age of Onset: (Not Specified)          Comment: HTN, MI  Problem: Cancer - Prostate      Relation: Brother          Age of Onset: (Not Specified)  Problem: Diabetes      Relation: FamHxNeg          Age of Onset: (Not Specified)      Allergies:   Review of Patient's Allergies indicates:  No Known Allergies    Medications:   (Not in a hospital admission)      Review of Systems:  Review of Systems:  HEENT: No loose teeth, nosebleeds, glaucoma, cataracts    Cardiovascular:  No chest pain, palpitations, HTN, hypercholesterolemia, MI, heart surgery or heart failure.  Pulmonary:  No pneumonia, COPD, chronic cough,TB or asthma.    Neuro:  No stroke, seizure, migraines or loss of consciousness.    Endocrine:  No diabetes or thyroid disease.    ID:  No TB, hepatitis A, hepatitis B, hepatitis C, HIV or rheumatic fever.  GU:  No hematuria, nephrolithiasis, kidney disease or bladder disease.  Heme/Onc:  No cancer, anemia or bleeding diathesis.   Derm:  No rash, hives, or shingles.    Psych: No anxiety disorder, depression, bullemia, anorexia nervosa, alcoholism or substance abuse  Musculoskeletal: No gout, arthritis, muscle disease or lupus.      Physical Exam:  Vital Signs: BP 135/84   Pulse 78   Temp 97.7 F (36.5 C) (Temporal)   Resp 18   Ht 5\' 1"  (1.549 m)   Wt 88 kg (194 lb)   LMP 04/05/2005   SpO2 97%   BMI 36.66 kg/m   General: nml body habitus.   HEENT: Normocephalic, atrumatic, PERRLA, Extra occular motions intact. No scleral icterus. Pharynx benign. Tongue midline.  Airway Evaluation:  Gag reflex intact: Yes  Ability to open mouth wide:   Full  Dentures:  No  Loose teeth:  No  Neck range of motion  Full  Mallampati Airway Classification: Class II   The same as Class I except the tonsilar pillars are hidden by the tongue.  Pulmonary: Clear to auscultation and percussion. No rales, wheezes or ronchi.  Cardiovascular: S1, S2, no S3, S4, clicks, rubs or murmurs.  JVD not elevated with  patient sitting.  Abdominal: Normal bowel sounds. No tenderness on light or deep palpation. No hepatomegally or spleenomegally.  Extremities: Without clubbing, cyanosis or edema.   Neurological: No asterixis. Sensation intact. Cranial nerves II - XII grossly intact. Normal gait.   Derm: No jaundice, hives or rashes.    ASA Classification: ASA Class II (a patient with mild systemic disease)    Impression:67 year old female of average risk for colon cancer.     Medical Decision Making:Plan is for Colonoscopy.

## 2023-09-01 NOTE — Anesthesia Preprocedure Evaluation (Signed)
Pre-Anesthetic Note  .      Patient: Deborah Mathis is a 67 year old female      Procedure Information       Date/Time: 09/01/23 0930    Scheduled providers: Joretta Bachelor., MD    Procedure: COLONOSCOPY    Diagnosis: Screening for colon cancer [Z12.11]    Location: [facility]            Relevant Problems   CARDIO   (+) Essential hypertension           Previous Anesthetic History:   History reviewed. No pertinent surgical history.        Medications  Current Outpatient Medications   Medication Sig    polyethylene glycol (GLYCOLAX) 17 GM/SCOOP powder Take 238 g by mouth See Admin Instructions Take as directed prior to colonoscopy    bisacodyl (DULCOLAX) 5 MG EC tablet Take 4 tablets by mouth See Admin Instructions Take as directed prior to colonoscopy    Simethicone 80 MG TABS Take 4 tablets by mouth See Admin Instructions Take as directed prior to colonoscopy.    albuterol HFA 108 (90 Base) MCG/ACT inhaler Inhale 2 puffs into the lungs See Admin Instructions Using spacer, inhale 1-2 puffs every 6 hours for 3 days, then as needed.    spironolactone-hydrochlorothiazide (ALDACTAZIDE) 25-25 MG per tablet Take 1 tablet by mouth in the morning. Please make an appointment with your PCP team prior to refills.    omeprazole (PRILOSEC) 20 MG capsule TAKE 1 CAPSULE BY MOUTH EVERY DAY AS NEEDED    Cholecalciferol (VITAMIN D3) 50 MCG (2000 UT) TABS TAKE 1 TABLET BY MOUTH EVERY DAY IN THE MORNING     No current facility-administered medications for this encounter.         Allergies:   Review of Patient's Allergies indicates:  No Known Allergies    Smoking, Alcohol, Drugs:  Social History    Tobacco Use      Smoking status: Never      Smokeless tobacco: Never    Alcohol use: No      Comment: 1 drin every 1-2 montrhs      Drug use:   No       PMHx:  Past Medical History:  No date: No known problems    Vitals  BP 135/84   Pulse 78   Temp 97.7 F (36.5 C) (Temporal)   Resp 18   Ht 5\' 1"  (1.549 m)   Wt  88 kg (194 lb)   LMP 04/05/2005   SpO2 97%   BMI 36.66 kg/m     Review of Systems     Patient summary reviewed      Anesthetic History:   negative anesthesia history ROS           Cardiovascular:  Positive for hypercholesterolemia and hypertension. Negative for chest pain and acute myocardial infarction.   Physical Activity in METs greater than 4    Pulmonary: Positive for asthma. Negative for obstructive sleep apnea, cough, recent URI and tobacco use.   GU/Renal: Negative for GU/renal diseases.    Hepatic: Skin negative for hepatic disease.    Neurological: Negative for neurological diseases.  Negative for seizures and strokes.   Hematological: Negative for hematological diseases.  Negative for DVT and bleeding disorder.   Endocrine: Negative for Diabetes, hyperthyroidism and hypothyroidism.   Eyes:  Negative for glaucoma.   Psychiatric:          Denied  alcohol and substance use       Physical Exam:     General     Level of consciousness:  Alert and oriented (time, person, place)   no respiratory distress syndrome     BMI   BMI greater than 30 kg/m2      Airway     Mallampati:  II    TM distance:  >3 FB    Mouth opening:  >3 FB    Neck ROM:  Full     Teeth  - normal exam  }     Heart  - normal exam       Lungs - normal exam  Breath sounds clear to auscultation          HEENT:      Abdomen:                            Pertinent Labs:   Lab Results   Component Value Date    NA 141 04/01/2023    K 3.9 04/01/2023    CREAT 0.8 04/01/2023    GLUCOSER 95 04/01/2023    WBC 7.3 07/31/2020    HCT 41.8 07/31/2020    PLTA 202 07/31/2020         Anesthesia Plan    ASA Score:     ASA:  2    Airway:      Mallampati:  II    Mouth opening:  >3 FB    Neck ROM:  Full    TM distance:  >3 FB     Plan: general and TIVA    Other information:     EKG Reviewed: : No      Full Stomach Precaution:: No      Post-Plan::  PACU    Informed Consent:     Anesthetic plan and risks discussed with:  Patient   Patient Consented        Attending  Anesthesiologist Statement:     Reassessed day of surgery: Yes        Assessment made, necessary equipment and appropriate plan in place.

## 2023-11-30 ENCOUNTER — Other Ambulatory Visit (HOSPITAL_BASED_OUTPATIENT_CLINIC_OR_DEPARTMENT_OTHER): Payer: Self-pay | Admitting: Internal Medicine

## 2023-11-30 DIAGNOSIS — K219 Gastro-esophageal reflux disease without esophagitis: Secondary | ICD-10-CM

## 2023-11-30 NOTE — Telephone Encounter (Signed)
PER Pharmacy, Deborah Mathis is a 68 year old female has requested a refill of      -  omeprazole        Last Office Visit: 5 28 24   with pcp   Last Physical Exam: 327 23      There are no preventive care reminders to display for this patient.     Other Med Adult:  Most Recent BP Reading(s)  09/01/23 : 122/75        Cholesterol (mg/dL)   Date Value   57/84/6962 208     LOW DENSITY LIPOPROTEIN DIRECT (mg/dL)   Date Value   95/28/4132 136     HIGH DENSITY LIPOPROTEIN (mg/dL)   Date Value   44/11/270 41     TRIGLYCERIDES (mg/dL)   Date Value   53/66/4403 168 (H)         No results found for: "TSHSC"      No results found for: "TSH"    HEMOGLOBIN A1C (%)   Date Value   04/01/2023 5.5       No results found for: "POCA1C"      No results found for: "INR"    SODIUM (mmol/L)   Date Value   04/01/2023 141       POTASSIUM (mmol/L)   Date Value   04/01/2023 3.9           CREATININE (mg/dL)   Date Value   47/42/5956 0.8        Documented patient preferred pharmacies:    [id number] - [place], [place] - [address]  Phone: [phone] Fax: [phone]

## 2023-12-15 ENCOUNTER — Other Ambulatory Visit (HOSPITAL_BASED_OUTPATIENT_CLINIC_OR_DEPARTMENT_OTHER): Payer: Self-pay | Admitting: Internal Medicine

## 2023-12-15 DIAGNOSIS — I10 Essential (primary) hypertension: Secondary | ICD-10-CM

## 2023-12-15 NOTE — Telephone Encounter (Signed)
PER Pharmacy, Deborah Mathis is a 68 year old female has requested a refill of      -  spironolactone-hctz       Last Office Visit: 04/01/23 with pcp       There are no preventive care reminders to display for this patient.     Other Med Adult:  Most Recent BP Reading(s)  09/01/23 : 122/75        Cholesterol (mg/dL)   Date Value   11/91/4782 208     LOW DENSITY LIPOPROTEIN DIRECT (mg/dL)   Date Value   95/62/1308 136     HIGH DENSITY LIPOPROTEIN (mg/dL)   Date Value   65/78/4696 41     TRIGLYCERIDES (mg/dL)   Date Value   29/52/8413 168 (H)         No results found for: "TSHSC"      No results found for: "TSH"    HEMOGLOBIN A1C (%)   Date Value   04/01/2023 5.5       No results found for: "POCA1C"      No results found for: "INR"    SODIUM (mmol/L)   Date Value   04/01/2023 141       POTASSIUM (mmol/L)   Date Value   04/01/2023 3.9           CREATININE (mg/dL)   Date Value   24/40/1027 0.8        Documented patient preferred pharmacies:    [id number] - [place], [place] - [address]  Phone: [phone] Fax: [phone]

## 2024-02-09 IMAGING — MR JOELHO ESQUERDO
4 of 6 series · 19 of 40 positions shown · non-contrast
Comparison: none

[Series 4: PD fat-sat · axial · 3.5mm · 0.31mm/px · z∈[-70,+33]mm · 8 of 24 slices shown (1 of 2)]
[im 1/24]
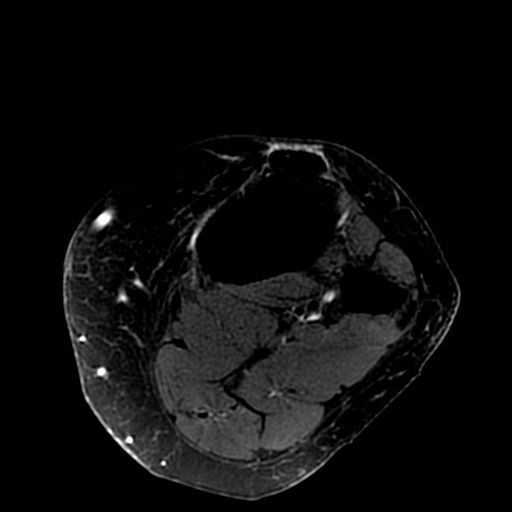
[im 3/24]
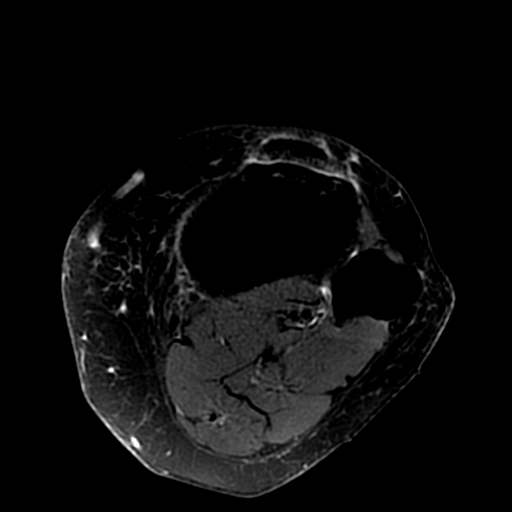
[im 8/24]
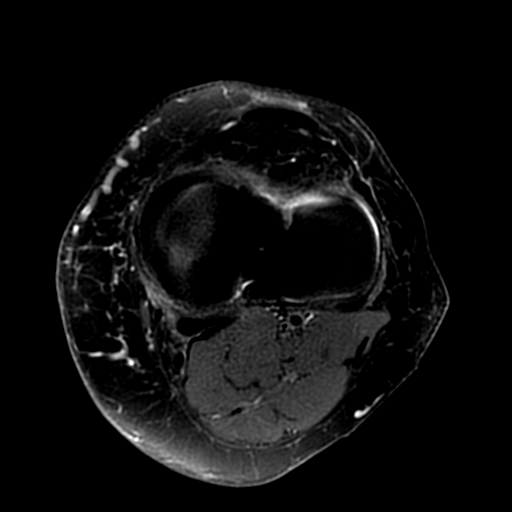
[im 11/24]
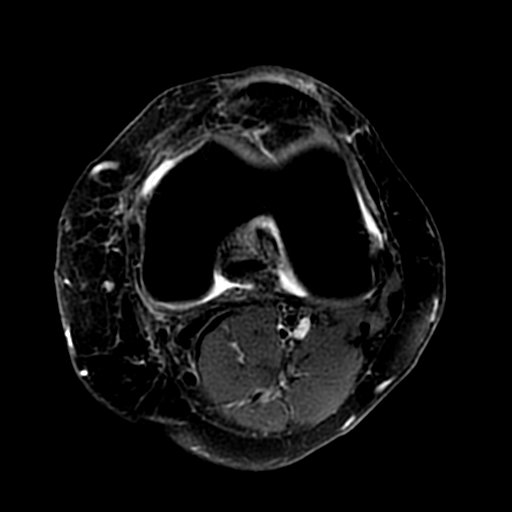
[im 13/24]
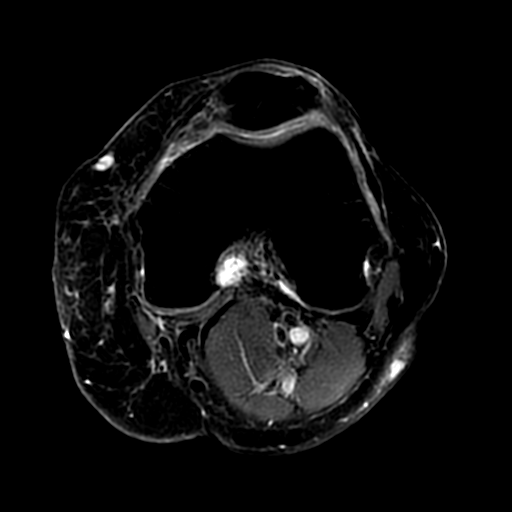
[im 16/24]
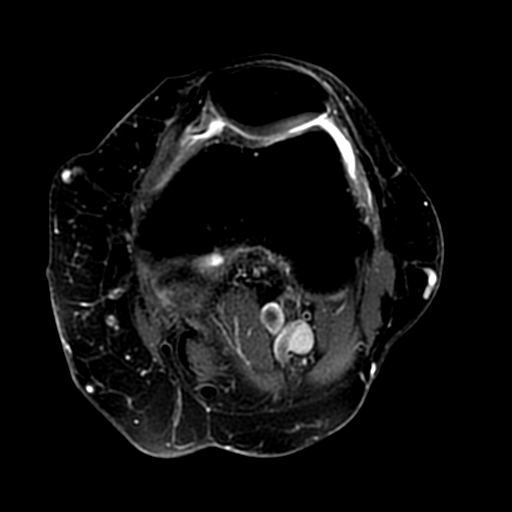
[im 21/24]
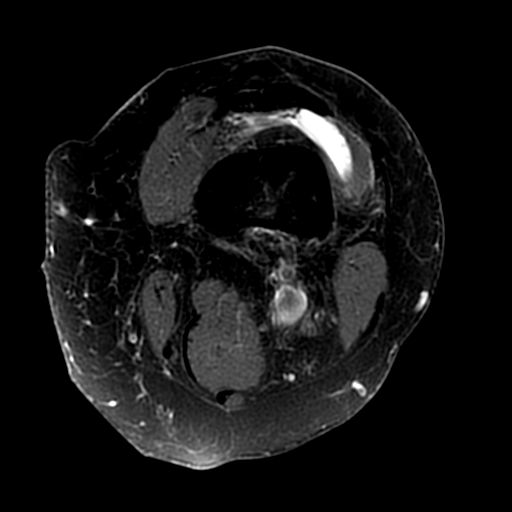
[im 24/24]
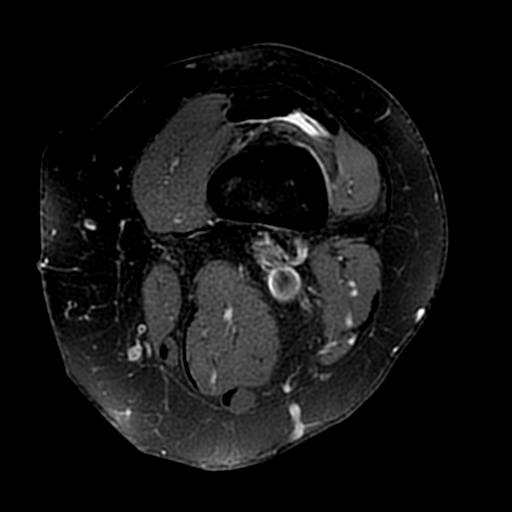

[Series 5: PD fat-sat · sagittal · 4.0mm · 0.31mm/px · 5 of 20 slices shown (2 of 2)]
[im 1/20]
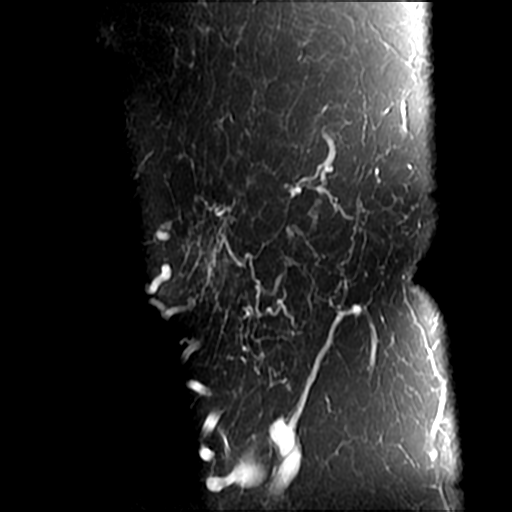
[im 4/20]
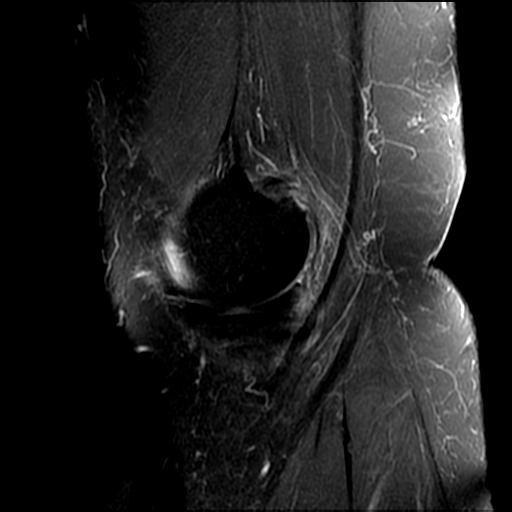
[im 7/20]
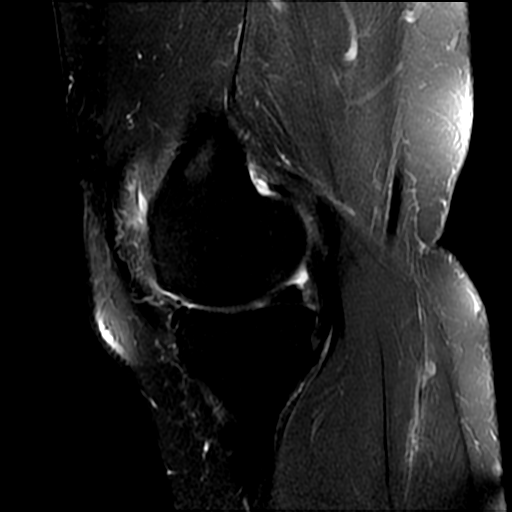
[im 10/20]
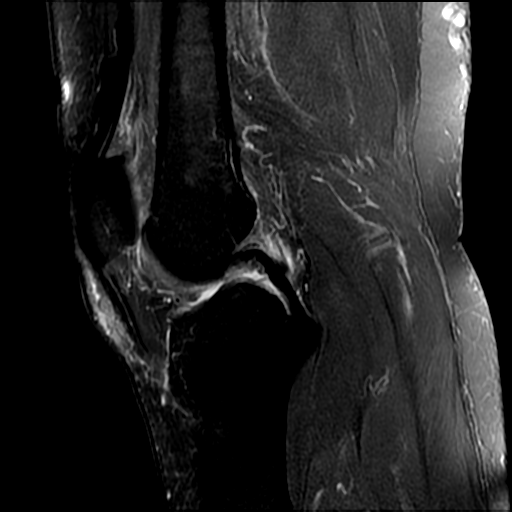
[im 16/20]
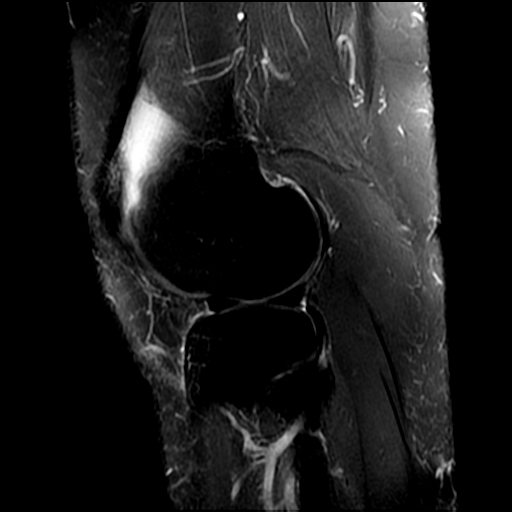

[Series 6: PD · sagittal · 4.0mm · 0.31mm/px · 3 of 20 slices shown]
[im 4/20]
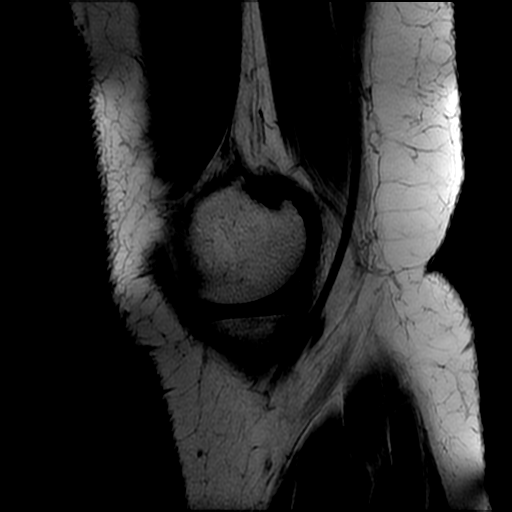
[im 10/20]
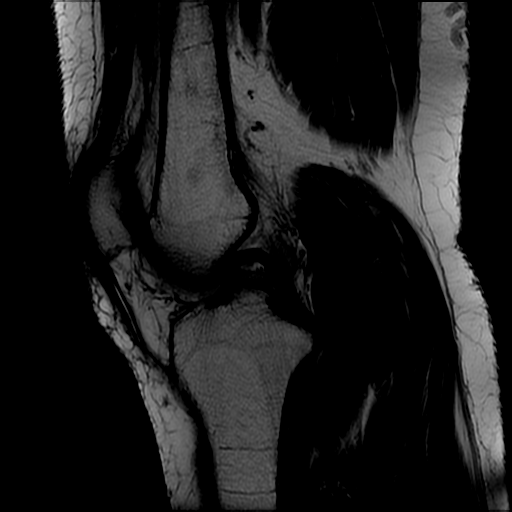
[im 16/20]
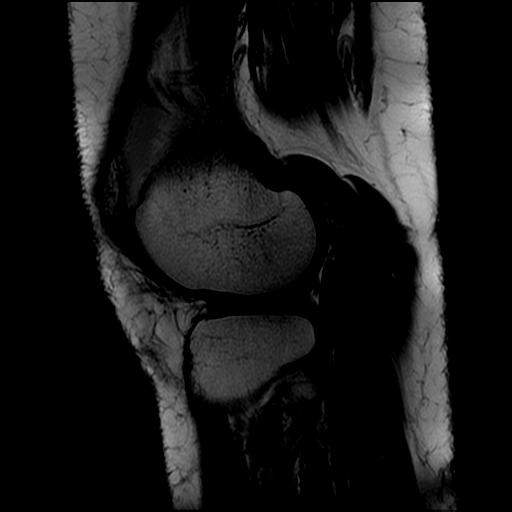

[Series 8: T1 · coronal · 4.5mm · 0.31mm/px · 3 of 16 slices shown]
[im 4/16]
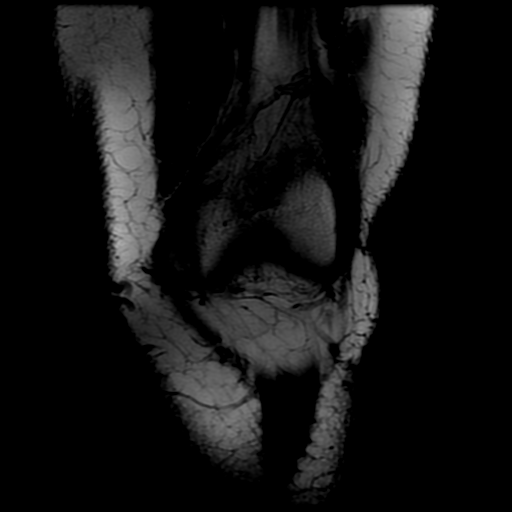
[im 10/16]
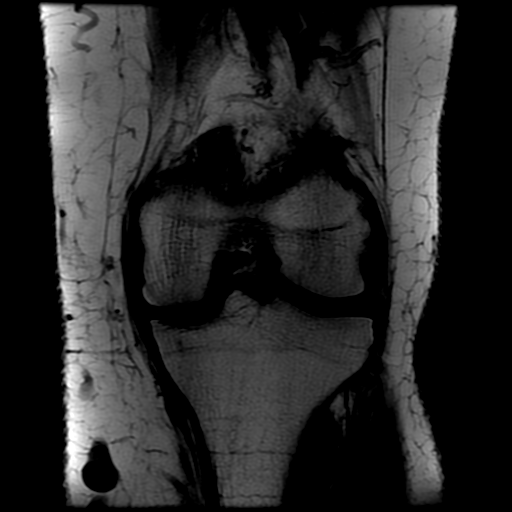
[im 16/16]
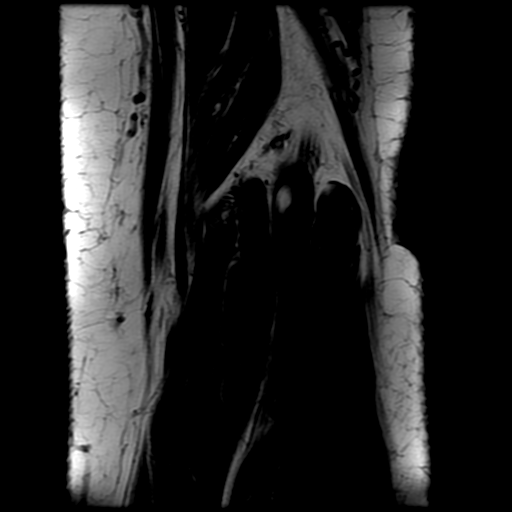

[19 of 40 positions shown; findings below may reference images not displayed]

Convênio e nº atendimento: [HOSPITAL]/[id number]

Informações disponíveis:Artrite reumatóide
TÉCNICA:
Exame realizado com sequências FSE e STIR, ponderadas em T1, DP e T2, em aquisições multiplanares.
RELATÓRIO:
O sulco troclear é raso / displásico em sua porção superior.
Osteofitose marginal femorotibial medial, lateral e patelofemoral.
RESSONANCIA MAGNÉTICA DO JOELHO ESQUERDO
Erosões condrais superficiais na faceta articular lateral da patela e tróclea femoral, sem alteração de sinal do
osso subcondral, caracterizando condropatia moderada.
Edema do coxim adiposo suprapatelar.
Alteração de sinal no interior do corno posterior do menisco medial, sem extensão definida à superfície
articular, caracterizando alteração degenerativa.
Demais segmentos meniscais conservados.
Ligamentos cruzados anterior e posterior com espessura, orientação e sinal normal.
Ligamentos colaterais com espessura e sinal conservados.
Estruturas do canto póstero-lateral sem alterações evidentes.
Tendão do quadríceps e patelar de espessura e sinal preservados.
Pequeno derrame articular.
Grupamentos musculares íntegros.
CONCLUSAO:
Osteofitose marginal femorotibial medial, lateral e patelofemoral.
Erosões condrais superficiais na faceta articular lateral da patela e tróclea femoral, sem alteração de sinal do
osso subcondral, caracterizando condropatia moderada.
Edema do coxim adiposo suprapatelar.
Alteração de sinal no interior do corno posterior do menisco medial, sem extensão definida à superfície
articular, caracterizando alteração degenerativa.
Pequeno derrame articular.

Hnili Wedam

## 2024-04-05 ENCOUNTER — Ambulatory Visit: Admitting: Internal Medicine

## 2024-04-06 ENCOUNTER — Ambulatory Visit (HOSPITAL_BASED_OUTPATIENT_CLINIC_OR_DEPARTMENT_OTHER): Payer: Self-pay | Admitting: Internal Medicine

## 2024-04-06 ENCOUNTER — Encounter (HOSPITAL_BASED_OUTPATIENT_CLINIC_OR_DEPARTMENT_OTHER): Payer: Self-pay | Admitting: Internal Medicine

## 2024-04-06 ENCOUNTER — Other Ambulatory Visit: Payer: Self-pay

## 2024-04-06 ENCOUNTER — Ambulatory Visit: Attending: Internal Medicine | Admitting: Internal Medicine

## 2024-04-06 VITALS — BP 121/77 | HR 59 | Temp 96.6°F | Ht 60.59 in | Wt 194.0 lb

## 2024-04-06 DIAGNOSIS — I10 Essential (primary) hypertension: Secondary | ICD-10-CM | POA: Insufficient documentation

## 2024-04-06 DIAGNOSIS — J4520 Mild intermittent asthma, uncomplicated: Secondary | ICD-10-CM | POA: Diagnosis present

## 2024-04-06 DIAGNOSIS — Z2821 Immunization not carried out because of patient refusal: Secondary | ICD-10-CM | POA: Diagnosis present

## 2024-04-06 DIAGNOSIS — Z13820 Encounter for screening for osteoporosis: Secondary | ICD-10-CM | POA: Insufficient documentation

## 2024-04-06 DIAGNOSIS — E559 Vitamin D deficiency, unspecified: Secondary | ICD-10-CM | POA: Diagnosis present

## 2024-04-06 DIAGNOSIS — R7303 Prediabetes: Secondary | ICD-10-CM | POA: Diagnosis not present

## 2024-04-06 DIAGNOSIS — Z1231 Encounter for screening mammogram for malignant neoplasm of breast: Secondary | ICD-10-CM | POA: Insufficient documentation

## 2024-04-06 LAB — HEMOGLOBIN A1C
ESTIMATED AVERAGE GLUCOSE: 114 mg/dL (ref 74–160)
HEMOGLOBIN A1C: 5.6 % (ref 4.0–5.6)

## 2024-04-06 LAB — BASIC METABOLIC PANEL
ANION GAP: 10 mmol/L (ref 10–22)
BUN (UREA NITROGEN): 16 mg/dL (ref 7–18)
CALCIUM: 9.2 mg/dL (ref 8.5–10.5)
CARBON DIOXIDE: 26 mmol/L (ref 21–32)
CHLORIDE: 102 mmol/L (ref 98–107)
CREATININE: 0.7 mg/dL (ref 0.4–1.2)
ESTIMATED GLOMERULAR FILT RATE: >60 mL/min (ref >60)
Glucose Random: 87 mg/dL (ref 74–160)
POTASSIUM: 4.1 mmol/L (ref 3.5–5.1)
SODIUM: 138 mmol/L (ref 136–145)

## 2024-04-06 MED ORDER — VITAMIN D3 50 MCG (2000 UT) PO TABS
ORAL_TABLET | ORAL | 3 refills | Status: AC
Start: 2024-04-06 — End: 2025-04-07

## 2024-04-06 MED ORDER — ALBUTEROL SULFATE HFA 108 (90 BASE) MCG/ACT IN AERS
2.0000 | INHALATION_SPRAY | RESPIRATORY_TRACT | 3 refills | Status: AC
Start: 2024-04-06 — End: 2025-04-07

## 2024-04-06 NOTE — Patient Instructions (Signed)
 I advise that you get the RSV VACCINE at your pharmacy

## 2024-04-06 NOTE — Progress Notes (Signed)
 Deborah Mathis is a 68 year old female here to review:  Essential hypertension  (primary encounter diagnosis)  Screening for osteoporosis  Prediabetes  Vitamin D  deficiency  Mild intermittent asthma without complication  Encounter for screening mammogram for malignant neoplasm of breast  COVID-19 vaccine dose declined    She has hypertension treated with spironolactone /thiazide combo pill.  Need to check BMP today.  Patient denies any exertional chest pain, dyspnea, palpitations, syncope, orthopnea, edema or paroxysmal nocturnal dyspnea.    She is using Rx without side effects.    She is known to have prediabetes.  HEMOGLOBIN A1C (%)   Date Value   04/01/2023 5.5   10/30/2022 5.9 (H)   07/31/2020 5.3     Weight has been stable:  Most Recent Weight Reading(s)  04/06/24 : 88 kg (194 lb 0.1 oz)  09/01/23 : 88 kg (194 lb)  04/01/23 : 90.1 kg (198 lb 9.6 oz)  10/30/22 : 88.3 kg (194 lb 9.6 oz)  08/27/22 : 88.4 kg (194 lb 14.2 oz)    She has not needed her inhalers recently but she should keep them on hand in case she has another asthma attack.  Will renew.      I ordered a mammogram which will be due in October..  I also ordered a BMD test today..    We talked about vaccines. She declined another COVID dose today.  I suggested an RSV vaccine at her pharmacy now.  And in the fall she should get a flu shot and another COVID dose with the new formulation.    BP 121/77   Pulse 59   Temp 96.6 F (35.9 C) (Temporal)   Ht 5' 0.59" (1.539 m)   Wt 88 kg (194 lb 0.1 oz)   LMP 04/05/2005   SpO2 96%   BMI 37.15 kg/m   EENT unremarkable  Neck supple..  Normal thyroid.  Normal carotids without bruits  Lungs clear  Heart with no murmur  Abdomen NT without mass\  No ankle edema    (I10) Essential hypertension  (primary encounter diagnosis)  Comment: controlled nicely on Rx.  Check BMP on Rx  Plan: BASIC METABOLIC PANEL             (Z13.820) Screening for osteoporosis  Comment: she will call to schedule BMD test  Plan: XR DXA BONE  DENSITOMETRY             (R73.03) Prediabetes  Comment: check A1C today  Plan: HEMOGLOBIN A1C             (E55.9) Vitamin D  deficiency  Comment: renew vitamin D   Plan: Cholecalciferol  (VITAMIN D3) 50 MCG (2000 UT)         TABS             (J45.20) Mild intermittent asthma without complication  Comment: renew albuterol  inhaler so she has it on hand for her next asthma episode.  The conditions is quiescent today  Plan: albuterol  HFA 108 (90 Base) MCG/ACT inhaler             (Z12.31) Encounter for screening mammogram for malignant neoplasm of breast  Comment: she will call to schedule mammogram in October  Plan: [place] SCREENING MAMMO BILATERAL DIGITAL WITH DBT &        CAD             (Z28.21) COVID-19 vaccine dose declined  Comment: offered and declined     Current Outpatient Medications   Medication Sig  spironolactone -hydrochlorothiazide  (ALDACTAZIDE) 25-25 MG per tablet Take 1 tablet by mouth daily Please make an appointment with your PCP team prior to refills    omeprazole  (PRILOSEC) 20 MG capsule TAKE 1 CAPSULE BY MOUTH EVERY DAY AS NEEDED    Cholecalciferol  (VITAMIN D3) 50 MCG (2000 UT) TABS TAKE 1 TABLET BY MOUTH EVERY DAY IN THE MORNING    albuterol  HFA 108 (90 Base) MCG/ACT inhaler Inhale 2 puffs into the lungs See Admin Instructions Using spacer, inhale 1-2 puffs every 6 hours for 3 days, then as needed.     No current facility-administered medications for this visit.     She is considering changing to the new care center in [place] because it is near their house.

## 2024-04-13 ENCOUNTER — Ambulatory Visit (HOSPITAL_BASED_OUTPATIENT_CLINIC_OR_DEPARTMENT_OTHER)

## 2024-07-02 ENCOUNTER — Encounter (HOSPITAL_BASED_OUTPATIENT_CLINIC_OR_DEPARTMENT_OTHER): Payer: Self-pay

## 2024-07-02 ENCOUNTER — Other Ambulatory Visit: Payer: Self-pay

## 2024-07-02 ENCOUNTER — Ambulatory Visit
Admission: RE | Admit: 2024-07-02 | Discharge: 2024-07-02 | Disposition: A | Discharge status: Home / Self Care | Source: Ambulatory Visit | Attending: Internal Medicine | Admitting: Internal Medicine

## 2024-07-02 DIAGNOSIS — Z1231 Encounter for screening mammogram for malignant neoplasm of breast: Secondary | ICD-10-CM | POA: Insufficient documentation

## 2024-11-25 ENCOUNTER — Other Ambulatory Visit (HOSPITAL_BASED_OUTPATIENT_CLINIC_OR_DEPARTMENT_OTHER): Payer: Self-pay | Admitting: Internal Medicine

## 2024-11-25 DIAGNOSIS — K219 Gastro-esophageal reflux disease without esophagitis: Secondary | ICD-10-CM

## 2024-11-25 NOTE — Telephone Encounter (Signed)
 PER who is calling: Pharmacy, Deborah Mathis is a 69 year old female has requested a refill of omeprazole  20.      Last Office Visit  : 04/06/2024 Hulda Charleston, MD  Last Tele Visit:  Visit date not found  Last Physical Exam:  01/28/2022    There are no preventive care reminders to display for this patient.    Other Med Adult:  Most Recent BP Reading(s)  04/06/24 : 121/77        Cholesterol (mg/dL)   Date Value   87/72/7976 208     LOW DENSITY LIPOPROTEIN DIRECT (mg/dL)   Date Value   87/72/7976 136     HIGH DENSITY LIPOPROTEIN (mg/dL)   Date Value   87/72/7976 41     TRIGLYCERIDES (mg/dL)   Date Value   87/72/7976 168 (H)         No results found for: TSHSC      No results found for: TSH    HEMOGLOBIN A1C (%)   Date Value   04/06/2024 5.6       No results found for: POCA1C      No results found for: INR    SODIUM (mmol/L)   Date Value   04/06/2024 138       POTASSIUM (mmol/L)   Date Value   04/06/2024 4.1           CREATININE (mg/dL)   Date Value   93/96/7974 0.7       Documented patient preferred pharmacies:    [id number] - [place], [place] - [address]  Phone: [phone] Fax: [phone]
